# Patient Record
Sex: Female | Born: 1965 | Race: White | Hispanic: No | Smoking: Never smoker
Health system: Southern US, Community
[De-identification: ages and names within clinical notes are randomized; demographics above are authoritative.]

## PROBLEM LIST (undated history)

## (undated) DIAGNOSIS — Z8249 Family history of ischemic heart disease and other diseases of the circulatory system: Secondary | ICD-10-CM

## (undated) DIAGNOSIS — F419 Anxiety disorder, unspecified: Secondary | ICD-10-CM

## (undated) DIAGNOSIS — E785 Hyperlipidemia, unspecified: Secondary | ICD-10-CM

## (undated) DIAGNOSIS — R079 Chest pain, unspecified: Secondary | ICD-10-CM

## (undated) DIAGNOSIS — F329 Major depressive disorder, single episode, unspecified: Secondary | ICD-10-CM

## (undated) DIAGNOSIS — R7309 Other abnormal glucose: Secondary | ICD-10-CM

## (undated) DIAGNOSIS — J309 Allergic rhinitis, unspecified: Secondary | ICD-10-CM

## (undated) HISTORY — DX: Hyperlipidemia, unspecified: E78.5

## (undated) HISTORY — DX: Anxiety disorder, unspecified: F41.9

## (undated) HISTORY — DX: Family history of ischemic heart disease and other diseases of the circulatory system: Z82.49

## (undated) HISTORY — DX: Other abnormal glucose: R73.09

## (undated) HISTORY — DX: Chest pain, unspecified: R07.9

## (undated) HISTORY — DX: Allergic rhinitis, unspecified: J30.9

## (undated) HISTORY — DX: Major depressive disorder, single episode, unspecified: F32.9

---

## 1998-01-21 ENCOUNTER — Other Ambulatory Visit: Admission: RE | Admit: 1998-01-21 | Discharge: 1998-01-21 | Payer: Self-pay | Admitting: Obstetrics and Gynecology

## 1999-01-29 ENCOUNTER — Other Ambulatory Visit: Admission: RE | Admit: 1999-01-29 | Discharge: 1999-01-29 | Payer: Self-pay | Admitting: Obstetrics and Gynecology

## 1999-12-09 ENCOUNTER — Other Ambulatory Visit: Admission: RE | Admit: 1999-12-09 | Discharge: 1999-12-09 | Payer: Self-pay | Admitting: Obstetrics and Gynecology

## 2001-02-08 ENCOUNTER — Other Ambulatory Visit: Admission: RE | Admit: 2001-02-08 | Discharge: 2001-02-08 | Payer: Self-pay | Admitting: Obstetrics and Gynecology

## 2002-02-09 ENCOUNTER — Other Ambulatory Visit: Admission: RE | Admit: 2002-02-09 | Discharge: 2002-02-09 | Payer: Self-pay | Admitting: Obstetrics and Gynecology

## 2003-03-27 ENCOUNTER — Other Ambulatory Visit: Admission: RE | Admit: 2003-03-27 | Discharge: 2003-03-27 | Payer: Self-pay | Admitting: Obstetrics and Gynecology

## 2004-03-25 ENCOUNTER — Ambulatory Visit (HOSPITAL_COMMUNITY): Admission: RE | Admit: 2004-03-25 | Discharge: 2004-03-25 | Payer: Self-pay | Admitting: Gastroenterology

## 2004-03-31 ENCOUNTER — Other Ambulatory Visit: Admission: RE | Admit: 2004-03-31 | Discharge: 2004-03-31 | Payer: Self-pay | Admitting: Obstetrics and Gynecology

## 2005-04-10 ENCOUNTER — Other Ambulatory Visit: Admission: RE | Admit: 2005-04-10 | Discharge: 2005-04-10 | Payer: Self-pay | Admitting: Obstetrics and Gynecology

## 2005-05-13 ENCOUNTER — Encounter: Admission: RE | Admit: 2005-05-13 | Discharge: 2005-05-13 | Payer: Self-pay | Admitting: Obstetrics and Gynecology

## 2008-02-13 ENCOUNTER — Ambulatory Visit: Payer: Self-pay | Admitting: Gastroenterology

## 2008-02-20 ENCOUNTER — Telehealth (INDEPENDENT_AMBULATORY_CARE_PROVIDER_SITE_OTHER): Payer: Self-pay | Admitting: *Deleted

## 2008-02-27 ENCOUNTER — Ambulatory Visit: Payer: Self-pay | Admitting: Gastroenterology

## 2010-12-26 NOTE — Op Note (Signed)
NAME:  Alexis Carlson, Alexis Carlson                            ACCOUNT NO.:  [id number]   MEDICAL RECORD NO.:  [id number]                   PATIENT TYPE:  AMB   LOCATION:  ENDO                                 FACILITY:  [facility]   PHYSICIAN:  Graylin Shiver, M.D.                DATE OF BIRTH:  Aug 03, 1966   DATE OF PROCEDURE:  03/25/2004  DATE OF DISCHARGE:                                 OPERATIVE REPORT   PROCEDURE:  Colonoscopy.   INDICATION:  Family history of colon cancer in the patient's father.   Informed consent was obtained after explanation of the risks of bleeding,  infection and perforation.   PREMEDICATIONS:  1. Fentanyl 60 mcg IV.  2. Versed 4 mg IV.   PROCEDURE:  With the patient in the left lateral decubitus position a rectal  exam was performed, no masses were felt.  The Olympus colonoscope was  inserted into the rectum and advanced around the colon to the cecum.  Cecal  landmarks are identified.  The cecum and ascending colon were normal, the  transverse colon normal.  The descending colon, sigmoid and rectum were  normal.  She tolerated the procedure well without complications.   IMPRESSION:  Normal colonoscopy to the cecum.                                               Graylin Shiver, M.D.    [name]  D:  03/25/2004  T:  03/25/2004  Job:  [id number]

## 2012-12-15 ENCOUNTER — Encounter: Payer: Self-pay | Admitting: Gastroenterology

## 2013-07-11 ENCOUNTER — Encounter (INDEPENDENT_AMBULATORY_CARE_PROVIDER_SITE_OTHER): Payer: Self-pay

## 2013-07-11 ENCOUNTER — Ambulatory Visit (INDEPENDENT_AMBULATORY_CARE_PROVIDER_SITE_OTHER): Payer: BC Managed Care – PPO | Admitting: Family Medicine

## 2013-07-11 VITALS — BP 133/92 | HR 91 | Temp 98.5°F

## 2013-07-11 DIAGNOSIS — R109 Unspecified abdominal pain: Secondary | ICD-10-CM

## 2013-07-11 DIAGNOSIS — Z0000 Encounter for general adult medical examination without abnormal findings: Secondary | ICD-10-CM

## 2013-07-11 DIAGNOSIS — R5381 Other malaise: Secondary | ICD-10-CM

## 2013-07-11 LAB — POCT UA - MICROSCOPIC ONLY
Bacteria, U Microscopic: NEGATIVE
Casts, Ur, LPF, POC: NEGATIVE
Crystals, Ur, HPF, POC: NEGATIVE
Mucus, UA: NEGATIVE
RBC, urine, microscopic: NEGATIVE
WBC, Ur, HPF, POC: NEGATIVE
Yeast, UA: NEGATIVE

## 2013-07-11 LAB — POCT URINALYSIS DIPSTICK
Bilirubin, UA: NEGATIVE
Blood, UA: NEGATIVE
Glucose, UA: NEGATIVE
Ketones, UA: NEGATIVE
Leukocytes, UA: NEGATIVE
Nitrite, UA: NEGATIVE
Protein, UA: NEGATIVE
Spec Grav, UA: <=1.005
Urobilinogen, UA: NEGATIVE
pH, UA: 5

## 2013-07-11 NOTE — Progress Notes (Signed)
   Subjective:    Patient ID: Alexis Carlson, female    DOB: 1965/10/21, 47 y.o.   MRN: [id number]  HPI  This 47 y.o. female presents for evaluation of fatigue, bowel changes and constipation, hair loss, Weight gain, and lower abdominal discomfort.  She has been feeling like this for over a month. She has not been in the office or had labs in over a year.  She has hx of anxiety.  Review of Systems C/o fatigue, constipation, hair loss   No chest pain, SOB, HA, dizziness, vision change, N/V, diarrhea, constipation, dysuria, urinary urgency or frequency, myalgias, arthralgias or rash.  Objective:   Physical Exam  Vital signs noted  Well developed well nourished female.  HEENT - Head atraumatic Normocephalic                Eyes - PERRLA, Conjuctiva - clear Sclera- Clear EOMI                Ears - EAC's Wnl TM's Wnl Gross Hearing WNL                Nose - Nares patent                 Throat - oropharanx wnl Respiratory - Lungs CTA bilateral Cardiac - RRR S1 and S2 without murmur GI - Abdomen soft tender lower quadrant and bowel sounds active x 4 Extremities - No edema. Neuro - Grossly intact.      Assessment & Plan:  Other malaise and fatigue - Plan: CMP14+EGFR, Thyroid Panel With TSH, Vitamin B12, Vit D  25 hydroxy (rtn osteoporosis monitoring), CBC With differential/Platelet, CANCELED: POCT CBC  Abdominal pain, unspecified site - Plan: CMP14+EGFR, POCT urinalysis dipstick, POCT UA - Microscopic Only, CBC With differential/Platelet, CANCELED: POCT CBC  Routine general medical examination at a health care facility - Plan: CMP14+EGFR, Thyroid Panel With TSH, Vitamin B12, Vit D  25 hydroxy (rtn osteoporosis monitoring), CBC With differential/Platelet, CANCELED: POCT CBC  Deatra Canter FNP

## 2013-07-12 ENCOUNTER — Other Ambulatory Visit: Payer: Self-pay | Admitting: Family Medicine

## 2013-07-12 LAB — CMP14+EGFR
ALT: 9 IU/L (ref 0–32)
AST: 11 IU/L (ref 0–40)
Albumin/Globulin Ratio: 1.9 (ref 1.1–2.5)
Albumin: 4.6 g/dL (ref 3.5–5.5)
Alkaline Phosphatase: 75 IU/L (ref 39–117)
BUN/Creatinine Ratio: 12 (ref 9–23)
BUN: 9 mg/dL (ref 6–24)
CO2: 25 mmol/L (ref 18–29)
Calcium: 9.6 mg/dL (ref 8.7–10.2)
Chloride: 98 mmol/L (ref 97–108)
Creatinine, Ser: 0.76 mg/dL (ref 0.57–1.00)
GFR calc Af Amer: 108 mL/min/{1.73_m2} (ref >59)
GFR calc non Af Amer: 94 mL/min/{1.73_m2} (ref >59)
Globulin, Total: 2.4 g/dL (ref 1.5–4.5)
Glucose: 93 mg/dL (ref 65–99)
Potassium: 3.6 mmol/L (ref 3.5–5.2)
Sodium: 138 mmol/L (ref 134–144)
Total Bilirubin: 0.7 mg/dL (ref 0.0–1.2)
Total Protein: 7 g/dL (ref 6.0–8.5)

## 2013-07-12 LAB — CBC WITH DIFFERENTIAL
Basophils Absolute: 0.1 10*3/uL (ref 0.0–0.2)
Basos: 1 %
Eos: 1 %
Eosinophils Absolute: 0.1 10*3/uL (ref 0.0–0.4)
HCT: 35.4 % (ref 34.0–46.6)
Hemoglobin: 12.9 g/dL (ref 11.1–15.9)
Immature Grans (Abs): 0 10*3/uL (ref 0.0–0.1)
Immature Granulocytes: 0 %
Lymphocytes Absolute: 2.8 10*3/uL (ref 0.7–3.1)
Lymphs: 28 %
MCH: 32.2 pg (ref 26.6–33.0)
MCHC: 36.4 g/dL — ABNORMAL HIGH (ref 31.5–35.7)
MCV: 88 fL (ref 79–97)
Monocytes Absolute: 0.8 10*3/uL (ref 0.1–0.9)
Monocytes: 8 %
Neutrophils Absolute: 6.2 10*3/uL (ref 1.4–7.0)
Neutrophils Relative %: 62 %
Platelets: 282 10*3/uL (ref 150–379)
RBC: 4.01 x10E6/uL (ref 3.77–5.28)
RDW: 13.1 % (ref 12.3–15.4)
WBC: 10 10*3/uL (ref 3.4–10.8)

## 2013-07-12 LAB — VITAMIN B12: Vitamin B-12: 483 pg/mL (ref 211–946)

## 2013-07-12 LAB — VITAMIN D 25 HYDROXY (VIT D DEFICIENCY, FRACTURES): Vit D, 25-Hydroxy: 26.9 ng/mL — ABNORMAL LOW (ref 30.0–100.0)

## 2013-07-12 LAB — THYROID PANEL WITH TSH
Free Thyroxine Index: 1.6 (ref 1.2–4.9)
T3 Uptake Ratio: 26 % (ref 24–39)
T4, Total: 6.2 ug/dL (ref 4.5–12.0)
TSH: 3.25 u[IU]/mL (ref 0.450–4.500)

## 2013-07-12 MED ORDER — VITAMIN D (ERGOCALCIFEROL) 1.25 MG (50000 UNIT) PO CAPS: 50000.0000 [IU] | ORAL_CAPSULE | ORAL | Status: DC

## 2013-07-14 ENCOUNTER — Telehealth: Payer: Self-pay | Admitting: Family Medicine

## 2013-07-24 NOTE — Telephone Encounter (Signed)
Message copied by Baltazar Apo on Mon Jul 24, 2013  8:53 AM ------      Message from: Deatra Canter      Created: Wed Jul 12, 2013  8:40 AM       Vitamin D is low and will rx vitamin D and take 1000 iu po qd otc.  B12 level is normal.  Thyroid labs normal.  Overall her labs look good.  Her fatigue could be due to deconditioning and would       Recommend exercise regimen.  If fatigue persists consider follow up to explore other possible reasons for fatigue. ------

## 2013-07-24 NOTE — Telephone Encounter (Signed)
Pt notified and verbalized understanding.

## 2013-09-07 ENCOUNTER — Encounter: Payer: Self-pay | Admitting: Gastroenterology

## 2013-12-06 ENCOUNTER — Emergency Department (HOSPITAL_COMMUNITY)
Admission: EM | Admit: 2013-12-06 | Discharge: 2013-12-06 | Disposition: A | Discharge status: Home / Self Care | Payer: BC Managed Care – PPO | Attending: Emergency Medicine | Admitting: Emergency Medicine

## 2013-12-06 ENCOUNTER — Encounter (HOSPITAL_COMMUNITY): Payer: Self-pay | Admitting: Emergency Medicine

## 2013-12-06 DIAGNOSIS — E119 Type 2 diabetes mellitus without complications: Secondary | ICD-10-CM | POA: Insufficient documentation

## 2013-12-06 DIAGNOSIS — Z8719 Personal history of other diseases of the digestive system: Secondary | ICD-10-CM | POA: Insufficient documentation

## 2013-12-06 DIAGNOSIS — N764 Abscess of vulva: Secondary | ICD-10-CM | POA: Insufficient documentation

## 2013-12-06 NOTE — ED Provider Notes (Signed)
CSN: [id number]     Arrival date & time 12/06/13  1104 History   First MD Initiated Contact with Patient 12/06/13 1129     Chief Complaint  Patient presents with  . Abscess     (Consider location/radiation/quality/duration/timing/severity/associated sxs/prior Treatment) HPI  Alexis Carlson is a(n) 48 y.o. female who presents to the emergency Department for chief complaint of labial abscess.  The patient complains of left labial pain since last Thursday and acutely worsening since Saturday.  Patient states that she noticed heat, redness, swelling extended from the top of the left labia down into her left perineum and gluteal area.  The patient was given a prescription for antibiotics by her OB/GYN however she did not have incision and drainage at that time.  Patient states that last night her pain became severe.  She had used to [employer]  She is applying warm compresses when the abscess opened and drained.  Her sister who is a Marine scientist took a look at it and expressed more of the purulence.  Sheet her sister also probed with a Q-tip and found no other tracking however she was concerned there may be loculations.  The patient states that today the area is significantly less painful, less swollen, less erythematous.  She wanted to make sure she didn't need any IV antibiotics or have any retained purulence in the abscess.  Patient denies any fevers, chills, myalgias, nausea or vomiting.  She denies any vaginal or urinary symptoms.  She is a history of diabetes.  She has a history of previous abscess teeth.  She does frequently shave her her genital region.  History reviewed. No pertinent past medical history. History reviewed. No pertinent past surgical history. No family history on file. History  Substance Use Topics  . Smoking status: Never Smoker   . Smokeless tobacco: Not on file  . Alcohol Use: Yes     Comment: social   OB History   Grav Para Term Preterm Abortions TAB SAB Ect Mult Living        Review of Systems  Ten systems reviewed and are negative for acute change, except as noted in the HPI.    Allergies  Review of patient's allergies indicates no known allergies.  Home Medications   Prior to Admission medications   Medication Sig Start Date End Date Taking? Authorizing Provider  venlafaxine XR (EFFEXOR-XR) 150 MG 24 hr capsule  06/27/13   Historical Provider, MD  Vitamin D, Ergocalciferol, (DRISDOL) 50000 UNITS CAPS capsule Take 1 capsule (50,000 Units total) by mouth every 7 (seven) days. 07/12/13   Lysbeth Penner, FNP   BP 109/73  Pulse 85  Temp(Src) 98.7 F (37.1 C) (Oral)  Resp 20  SpO2 99% Physical Exam Physical Exam  Nursing note and vitals reviewed. Constitutional: She is oriented to person, place, and time. She appears well-developed and well-nourished. No distress.  HENT:  Head: Normocephalic and atraumatic.  Eyes: Conjunctivae normal and EOM are normal. Pupils are equal, round, and reactive to light. No scleral icterus.  Neck: Normal range of motion.  Cardiovascular: Normal rate, regular rhythm and normal heart sounds.  Exam reveals no gallop and no friction rub.   No murmur heard. Pulmonary/Chest: Effort normal and breath sounds normal. No respiratory distress.  Abdominal: Soft. Bowel sounds are normal. She exhibits no distension and no mass. There is no tenderness. There is no guarding.  Neurological: She is alert and oriented to person, place, and time.  Skin: Skin is warm and dry. She  is not diaphoretic.  Genitourinary: Left labia with erythema, mild swelling, there is a 1 cm area of crusting no central fluctuants, new noted induration, no streaking.  Mild tenderness to palpation.  ED Course  Procedures (including critical care time) Labs Review Labs Reviewed - No data to display  Imaging Review No results found.   EKG Interpretation None     Bedside ultrasound is performed.  There were only appears to be cellulitic tissue, no  retained pockets of purulence. MDM   Final diagnoses:  Left genital labial abscess   No I&D is necessary at this time.  Patient may continue with her prescribed antibiotics.  I have discussed return precautions.  She should have her wound checked in 2 days.  Patient verbalizes her understanding and agrees with plan of care.    Margarita Mail, PA-C 12/06/13 1258

## 2013-12-06 NOTE — Discharge Instructions (Signed)
Disorders of the Vulva It is important to know the outside (external) area of the female genitalia to properly examine yourself. The vulva or labia, sometimes also called the lips around the vagina, covers the pubic bone and surrounds the vagina. The larger or outer labia is called the labia majora. The inner or smaller labia is called the labia minora. The clitoris is at the top of the vulva. It is covered by a small area of tissue called the hood. Below the clitoris is the opening of the tube from the bladder, which is the urethral opening. Just below the urethral opening is the opening of the vagina. This area is called the vestibule. Inside the vestibule are bartholin and skene glands that lubricate the outside of the vagina during sexual intercourse. The perineum is the area that lies between the vagina and anus. You should examine your external genitalia once a month just as you do your self breast examination. SIGNS AND SYMPTOMS TO LOOK FOR DURING YOUR EXAMINATION:  Swelling.  Redness.  Bumps.  Blisters.  Black or white areas.  Itching.  Bleeding.  Burning. TYPES OF VULVAR PROBLEMS  Infections.  Yeast (fungus) is the most common infection that causes redness, swelling, itching and a thick white vaginal discharge. Women with diabetes, taking medicines that kill germs (antibiotics) or on birth control pills are at risk for yeast infection. This infection is treated with vaginal creams, suppositories and anti-itch cream for the vulva.  Genital warts (condyloma) are caused by the human papilloma virus. They are raised bumps that can itch, bleed, cause discomfort and sometimes appear in groups like cauliflower. This is a sexually transmitted disease (STD) caused by sexual contact. This can be treated with topical medication, freezing, electrocautery, laser or surgical removal.  Genital herpes is a virus that causes redness, swelling, blisters and ulcers that can cause itching and are  very painful. This is also a STD. There are medications to control the symptoms of herpes, but there is no cure. Once you have it, you keep getting it because the virus stays in your body.  Contact dermatitis. Contact dermatitis is an irritation of the vulva that can cause redness, swelling and itching. It can be caused by:  Perfumed toilet paper.  Deodorants.  Talcum powder.  Hygiene sprays.  Tampons.  Soaps and fabric softeners.  Wearing wet underwear and bathing suits too long.  Spermicide.  Condoms.  Diaphragms.  Poison ivy. Treatment will depend on eliminating the cause and treating the symptoms.  Vulvodynia.  Vulvodynia is "painful vulva." It includes burning, itching, irritation and a feeling of rawness or bruising of the vulva. Treating this problem depends on the cause and diagnosis. Treatment can take a long time.  Vulvar Dystrophy.  Vulvar Dystrophy is a disorder of the skin of the vulva. The skin can be too thick with hard raised patches or too thin skin showing wrinkles. Vulvar dystrophy can cause redness or white patches, itching and burning sensation. A biopsy may be needed to diagnose the problem. Treatment with creams or ointments depends on the diagnosis.  Vulvar Cancer.  Vulvar cancer is usually a form of squamus skin cancer. Other types of vulvar cancer like melenoma (a dark or black, irregular shaped mole that can bleed easily) and adenocarcinoma (red, itchy, scaly area that looks like eczema) are rare. Treatment is usually surgery to remove the cancerous area, the vulva and the lymph nodes in the groin. This can be done with or without radiation and chemotherapy. HOME   CARE INSTRUCTIONS   Look at your vulva and external genital area every month.  Follow and finish all treatment given to you by your caregiver.  Do not use perfumed or scented soaps, detergents, hygiene spray, talcum powder, tampons, douches or toilet paper.  Remove your tampon every 6  hours. Do not leave tampons in overnight.  Wear loose-fitting clothing.  Wear underwear that is cotton.  Avoid spermicidal creams or foams that irritate you. SEEK MEDICAL CARE IF:   You notice changes on your vulva such as redness, swelling, itching, color changes, bleeding, small bumps, blisters or any discomfort.  You develop an abnormal vaginal discharge.  You have painful sexual intercourse.  You notice swelling of the lymph nodes in your groin. Document Released: 01/13/2008 Document Revised: 10/19/2011 Document Reviewed: 10/27/2010 [facility] Patient Information 2014 [place], [place]. Abscess Care After An abscess (also called a boil or furuncle) is an infected area that contains a collection of pus. Signs and symptoms of an abscess include pain, tenderness, redness, or hardness, or you may feel a moveable soft area under your skin. An abscess can occur anywhere in the body. The infection may spread to surrounding tissues causing cellulitis. A cut (incision) by the surgeon was made over your abscess and the pus was drained out. Gauze may have been packed into the space to provide a drain that will allow the cavity to heal from the inside outwards. The boil may be painful for 5 to 7 days. Most people with a boil do not have high fevers. Your abscess, if seen early, may not have localized, and may not have been lanced. If not, another appointment may be required for this if it does not get better on its own or with medications. HOME CARE INSTRUCTIONS   Only take over-the-counter or prescription medicines for pain, discomfort, or fever as directed by your caregiver.  When you bathe, soak and then remove gauze or iodoform packs at least daily or as directed by your caregiver. You may then wash the wound gently with mild soapy water. Repack with gauze or do as your caregiver directs. SEEK IMMEDIATE MEDICAL CARE IF:   You develop increased pain, swelling, redness, drainage, or bleeding in the  wound site.  You develop signs of generalized infection including muscle aches, chills, fever, or a general ill feeling.  An oral temperature above 102 F (38.9 C) develops, not controlled by medication. See your caregiver for a recheck if you develop any of the symptoms described above. If medications (antibiotics) were prescribed, take them as directed. Document Released: 02/12/2005 Document Revised: 10/19/2011 Document Reviewed: 10/10/2007 [facility] Patient Information 2014 [place].

## 2013-12-06 NOTE — ED Provider Notes (Signed)
Medical screening examination/treatment/procedure(s) were performed by non-physician practitioner and as supervising physician I was immediately available for consultation/collaboration.   EKG Interpretation None        Charles B. Karle Starch, MD 12/06/13 1258

## 2013-12-06 NOTE — ED Notes (Signed)
Pt presents to department for evaluation of vaginal abscess. Ongoing since Thursday. Pt states drainage from site and increased pain today. Was started on antibiotics by OBGYN last week for same. 8/10 pain upon arrival. Pt is alert and oriented x4.

## 2014-05-22 ENCOUNTER — Encounter: Payer: Self-pay | Admitting: Nurse Practitioner

## 2014-05-22 ENCOUNTER — Ambulatory Visit (INDEPENDENT_AMBULATORY_CARE_PROVIDER_SITE_OTHER): Payer: BC Managed Care – PPO | Admitting: Nurse Practitioner

## 2014-05-22 VITALS — BP 121/89 | HR 91 | Temp 97.5°F | Ht 60.0 in | Wt 169.0 lb

## 2014-05-22 DIAGNOSIS — F32A Depression, unspecified: Secondary | ICD-10-CM

## 2014-05-22 DIAGNOSIS — J0110 Acute frontal sinusitis, unspecified: Secondary | ICD-10-CM

## 2014-05-22 DIAGNOSIS — J309 Allergic rhinitis, unspecified: Secondary | ICD-10-CM

## 2014-05-22 DIAGNOSIS — F329 Major depressive disorder, single episode, unspecified: Secondary | ICD-10-CM | POA: Insufficient documentation

## 2014-05-22 DIAGNOSIS — G4452 New daily persistent headache (NDPH): Secondary | ICD-10-CM

## 2014-05-22 HISTORY — DX: Allergic rhinitis, unspecified: J30.9

## 2014-05-22 HISTORY — DX: Depression, unspecified: F32.A

## 2014-05-22 MED ORDER — AZITHROMYCIN 250 MG PO TABS: ORAL_TABLET | ORAL | Status: DC

## 2014-05-22 NOTE — Patient Instructions (Signed)

## 2014-05-22 NOTE — Progress Notes (Signed)
   Subjective:    Patient ID: Alexis Carlson, female    DOB: 1966-04-09, 48 y.o.   MRN: [id number]  HPI Patient has been having frequent headaches- can't tell if it is her sinuses or migraines- Headaches are worse on the weekends- She said that the pain is along the bridge of her nose- She has tried a bunch of OTC meds with minimal relief. SHe is on contrave for weight loss and she is not sure if that is causing headache or not.  * denies over consumption of caffeine- no goody powders  Review of Systems  Constitutional: Negative.   HENT: Positive for congestion and sinus pressure.   Respiratory: Negative.  Negative for cough.   Cardiovascular: Negative.   Gastrointestinal: Negative.   Neurological: Positive for dizziness and headaches.  Hematological: Negative.   Psychiatric/Behavioral: Negative.   All other systems reviewed and are negative.      Objective:   Physical Exam  Constitutional: She is oriented to person, place, and time. She appears well-developed and well-nourished.  HENT:  Right Ear: Hearing, tympanic membrane, external ear and ear canal normal.  Left Ear: Hearing, tympanic membrane, external ear and ear canal normal.  Nose: Mucosal edema and rhinorrhea present. Right sinus exhibits frontal sinus tenderness. Right sinus exhibits no maxillary sinus tenderness. Left sinus exhibits frontal sinus tenderness. Left sinus exhibits no maxillary sinus tenderness.  Mouth/Throat: Uvula is midline, oropharynx is clear and moist and mucous membranes are normal.  Eyes: Pupils are equal, round, and reactive to light.  Neck: Normal range of motion. Neck supple.  Cardiovascular: Normal rate and normal heart sounds.   Pulmonary/Chest: Effort normal and breath sounds normal.  Abdominal: Soft. Bowel sounds are normal.  Lymphadenopathy:    She has no cervical adenopathy.  Neurological: She is alert and oriented to person, place, and time. She has normal reflexes.  Skin: Skin is warm and  dry.  Psychiatric: She has a normal mood and affect. Her behavior is normal. Judgment and thought content normal.     BP 121/89  Pulse 91  Temp(Src) 97.5 F (36.4 C) (Oral)  Ht 5' (1.524 m)  Wt 169 lb (76.658 kg)  BMI 33.01 kg/m2      Assessment & Plan:  1. Acute frontal sinusitis, recurrence not specified 1. Take meds as prescribed 2. Use a cool mist humidifier especially during the winter months and when heat has been humid. 3. Use saline nose sprays frequently 4. Saline irrigations of the nose can be very helpful if done frequently.  * 4X daily for 1 week*  * Use of a nettie pot can be helpful with this. Follow directions with this* 5. Drink plenty of fluids 6. Keep thermostat turn down low 7.For any cough or congestion  Use plain Mucinex- regular strength or max strength is fine   * Children- consult with Pharmacist for dosing 8. For fever or aces or pains- take tylenol or ibuprofen appropriate for age and weight.  * for fevers greater than 101 orally you may alternate ibuprofen and tylenol every  3 hours.    - azithromycin (ZITHROMAX Z-PAK) 250 MG tablet; As directed  Dispense: 6 each; Refill: 0  2. New daily persistent headache Headache diary Avoid all caffeine   Mary-Margaret Hassell Done, FNP

## 2014-05-24 ENCOUNTER — Telehealth: Payer: Self-pay | Admitting: Nurse Practitioner

## 2014-05-24 NOTE — Telephone Encounter (Signed)
Motrin OTC

## 2014-05-25 NOTE — Telephone Encounter (Signed)
Left message with suggestions. Asked patient to return call if necessary.

## 2015-08-29 ENCOUNTER — Encounter: Payer: Self-pay | Admitting: Gastroenterology

## 2018-12-12 ENCOUNTER — Telehealth: Payer: Self-pay | Admitting: Interventional Cardiology

## 2018-12-12 NOTE — Telephone Encounter (Signed)
Spoke with pt and she was agreeable to a virtual visit with Dr. Tamala Julian on 5/12 using [employer].  Advised someone would call her to go over details.  Pt appreciative for call.

## 2018-12-19 NOTE — Progress Notes (Signed)
Virtual Visit via Video Note   This visit type was conducted due to national recommendations for restrictions regarding the COVID-19 Pandemic (e.g. social distancing) in an effort to limit this patient's exposure and mitigate transmission in our community.  Due to her co-morbid illnesses, this patient is at least at moderate risk for complications without adequate follow up.  This format is felt to be most appropriate for this patient at this time.  All issues noted in this document were discussed and addressed.  A limited physical exam was performed with this format.  Please refer to the patient's chart for her consent to telehealth for [facility].   Date:  12/19/2018   ID:  Alexis Carlson, DOB [birth year], MRN [id number]  Patient Location: Home Provider Location: Office  PCP:  Chevis Pretty, FNP  Cardiologist:  No primary care provider on file.  Electrophysiologist:  None   Evaluation Performed:  Consultation - Lodie Waheed was referred by Dr. Hinton Dyer Collins([facility]) for the evaluation because of a strong family h/o heart disease..  Chief Complaint:  Risk Factors  History of Present Illness:    Alexis Carlson is a 53 y.o. female with personal history of hyperlipidemia, and strong family history of ischemic heart disease and myocardial infarction (2 sisters, mother, and father).  Alexis Carlson notices dyspnea and some mild tightness in her chest with physical activity.  She is a Radio producer.  Physical activity has significantly decreased since the pandemic started and requires that she work by computer from home.  She is particularly concerned about her cardiovascular health because of her family history and risk profile with significant elevation in triglycerides and LDL cholesterol.  Most recent triglyceride was 214 with an LDL of 152 on the VASCEPA.  At the time of the most recent laboratory data, atorvastatin 20 mg was added.  She does not smoke.  She does not believe she snores.  She feels  sleeping is not a problem but she does get tired pretty easily during the day.  No history of diabetes.  The patient does not have symptoms concerning for COVID-19 infection (fever, chills, cough, or new shortness of breath).    Past Medical History:  Diagnosis Date   Allergic rhinitis 05/22/2014   Depression (emotion) 05/22/2014   FH: heart disease    No past surgical history on file.   No outpatient medications have been marked as taking for the 12/20/18 encounter (Appointment) with Belva Crome, MD.     Allergies:   Patient has no known allergies.   Social History   Tobacco Use   Smoking status: Never Smoker  Substance Use Topics   Alcohol use: Yes    Comment: social   Drug use: No     Family Hx: The patient's family history is not on file.  ROS:   Please see the history of present illness.    Obesity and decreased physical activity.  Anxiety for which she uses Effexor XR. All other systems reviewed and are negative.   Prior CV studies:   The following studies were reviewed today:  No cardiac imaging.  Labs/Other Tests and Data Reviewed:    EKG:  An ECG dated March 2020 was personally reviewed today and demonstrated:  Reveals normal sinus rhythm with normal appearance.  Recent Labs: No results found for requested labs within last 8760 hours.   Recent Lipid Panel No results found for: CHOL, TRIG, HDL, CHOLHDL, LDLCALC, LDLDIRECT  Wt Readings from Last 3 Encounters:  05/22/14 169 lb (76.7  kg)     Objective:    Vital Signs:  There were no vitals taken for this visit.   VITAL SIGNS:  reviewed GEN:  no acute distress RESPIRATORY:  normal respiratory effort, symmetric expansion CARDIOVASCULAR:  no peripheral edema NEURO:  alert and oriented x 3, no obvious focal deficit  ASSESSMENT & PLAN:    1. Family history of early CAD   2. Hyperlipidemia LDL goal <70   3. 2019 novel coronavirus disease (COVID-19)    PLAN:  1. Chest tightness and  dyspnea on exertion occur with moderate to heavy physical activity.  This is not alarmed the patient because she does not feel limited in her typical daily activities.  The symptoms only occur with walking greater than 50 yards briskly or if going up several flights of stairs.  Coronary CT angios with morphology and FFR if needed will be ordered. 2. Family history is significant and is noted to involve both mom, father, and 2 siblings all before the age of 37.   2. May have heterozygous hypercholesterolemia/mixed hyperlipidemia.  Currently on icosapent ethyl and low to medium intensity statin therapy.  Reassess lipids along with other markers including an LP(a), high-sensitivity CRP, and hemoglobin A1c in 1 month.  Overall education and awareness concerning primary risk prevention was discussed in detail: LDL less than 70, hemoglobin A1c less than 7, blood pressure target less than 130/80 mmHg, >150 minutes of moderate aerobic activity per week, avoidance of smoking, weight control (via diet and exercise), and continued surveillance/management of/for obstructive sleep apnea.   COVID-19 Education: The signs and symptoms of COVID-19 were discussed with the patient and how to seek care for testing (follow up with PCP or arrange E-visit).  The importance of social distancing was discussed today.  Time:   Today, I have spent 26 minutes with the patient with telehealth technology discussing the above problems.     Medication Adjustments/Labs and Tests Ordered: Current medicines are reviewed at length with the patient today.  Concerns regarding medicines are outlined above.   Tests Ordered: No orders of the defined types were placed in this encounter.   Medication Changes: No orders of the defined types were placed in this encounter.   Disposition:  Follow up in 3 month(s)  Signed, Sinclair Grooms, MD  12/19/2018 3:41 PM    [facility]

## 2018-12-20 ENCOUNTER — Other Ambulatory Visit: Payer: Self-pay

## 2018-12-20 ENCOUNTER — Telehealth (INDEPENDENT_AMBULATORY_CARE_PROVIDER_SITE_OTHER): Payer: BC Managed Care – PPO | Admitting: Interventional Cardiology

## 2018-12-20 ENCOUNTER — Telehealth: Payer: Self-pay

## 2018-12-20 VITALS — Ht 60.0 in | Wt 172.0 lb

## 2018-12-20 DIAGNOSIS — R0789 Other chest pain: Secondary | ICD-10-CM

## 2018-12-20 DIAGNOSIS — U071 COVID-19: Secondary | ICD-10-CM

## 2018-12-20 DIAGNOSIS — Z8249 Family history of ischemic heart disease and other diseases of the circulatory system: Secondary | ICD-10-CM

## 2018-12-20 DIAGNOSIS — E785 Hyperlipidemia, unspecified: Secondary | ICD-10-CM

## 2018-12-20 MED ORDER — METOPROLOL TARTRATE 100 MG PO TABS: ORAL_TABLET | ORAL | 0 refills | Status: DC

## 2018-12-20 NOTE — Patient Instructions (Addendum)
Medication Instructions:  Your physician recommends that you continue on your current medications as directed. Please refer to the Current Medication list given to you today.  If you need a refill on your cardiac medications before your next appointment, please call your pharmacy.   Lab work: Your physician recommends that you return for lab work in: 1 month. You will need to be fasting for these labs (nothing to eat or drink after midnight except water and black coffee).  If you have labs (blood work) drawn today and your tests are completely normal, you will receive your results only by: Marland Kitchen MyChart Message (if you have MyChart) OR . A paper copy in the mail If you have any lab test that is abnormal or we need to change your treatment, we will call you to review the results.  Testing/Procedures: Non-Cardiac CT scanning, (CAT scanning), is a noninvasive, special x-ray that produces cross-sectional images of the body using x-rays and a computer. CT scans help physicians diagnose and treat medical conditions. For some CT exams, a contrast material is used to enhance visibility in the area of the body being studied. CT scans provide greater clarity and reveal more details than regular x-ray exams.   Follow-Up: At [facility], you and your health needs are our priority.  As part of our continuing mission to provide you with exceptional heart care, we have created designated Provider Care Teams.  These Care Teams include your primary Cardiologist (physician) and Advanced Practice Providers (APPs -  Physician Assistants and Nurse Practitioners) who all work together to provide you with the care you need, when you need it. You will need a follow up appointment in 2 months.  Please call our office 2 months in advance to schedule this appointment.  You may see Dr. Tamala Julian or one of the following Advanced Practice Providers on your designated Care Team:   Truitt Merle, NP Cecilie Kicks, NP . Kathyrn Drown, NP  Any Other Special Instructions Will Be Listed Below (If Applicable).  Please arrive at the [facility] main entrance of [facility] at 30-45 minutes prior to test start time  [facility] [place], [place], [place] [address] [phone]  Proceed to the [facility] [place] Radiology Department (First Floor).  Please follow these instructions carefully (unless otherwise directed):  On the Night Before the Test: . Be sure to Drink plenty of water. . Do not consume any caffeinated/decaffeinated beverages or chocolate 12 hours prior to your test. . Do not take any antihistamines 12 hours prior to your test. . If you take Metformin do not take 24 hours prior to test.  On the Day of the Test: . Drink plenty of water. Do not drink any water within one hour of the test. . Do not eat any food 4 hours prior to the test. . You may take your regular medications prior to the test.  . Take metoprolol (Lopressor) two hours prior to test. . HOLD Furosemide/Hydrochlorothiazide morning of the test.       After the Test: . Drink plenty of water. . After receiving IV contrast, you may experience a mild flushed feeling. This is normal. . On occasion, you may experience a mild rash up to 24 hours after the test. This is not dangerous. If this occurs, you can take Benadryl 25 mg and increase your fluid intake. . If you experience trouble breathing, this can be serious. If it is severe call 911 IMMEDIATELY. If it is  mild, please call our office. . If you take any of these medications: Glipizide/Metformin, Avandament, Glucavance, please do not take 48 hours after completing test.

## 2018-12-20 NOTE — Telephone Encounter (Signed)
YOUR CARDIOLOGY TEAM HAS ARRANGED FOR AN E-VISIT FOR YOUR APPOINTMENT - PLEASE REVIEW IMPORTANT INFORMATION BELOW SEVERAL DAYS PRIOR TO YOUR APPOINTMENT  Due to the recent COVID-19 pandemic, we are transitioning in-person office visits to tele-medicine visits in an effort to decrease unnecessary exposure to our patients, their families, and staff. These visits are billed to your insurance just like a normal visit is. We also encourage you to sign up for MyChart if you have not already done so. You will need a smartphone if possible. For patients that do not have this, we can still complete the visit using a regular telephone but do prefer a smartphone to enable video when possible. You may have a family member that lives with you that can help. If possible, we also ask that you have a blood pressure cuff and scale at home to measure your blood pressure, heart rate and weight prior to your scheduled appointment. Patients with clinical needs that need an in-person evaluation and testing will still be able to come to the office if absolutely necessary. If you have any questions, feel free to call our office.     YOUR PROVIDER WILL BE USING THE FOLLOWING PLATFORM TO COMPLETE YOUR VISIT: Doximity  . IF USING MYCHART - How to Download the MyChart App to Your SmartPhone   - If Apple, go to App Store and type in MyChart in the search bar and download the app. If Android, ask patient to go to Google Play Store and type in MyChart in the search bar and download the app. The app is free but as with any other app downloads, your phone may require you to verify saved payment information or Apple/Android password.  - You will need to then log into the app with your MyChart username and password, and select [facility] as your healthcare provider to link the account.  - When it is time for your visit, go to the MyChart app, find appointments, and click Begin Video Visit. Be sure to Select Allow for your device to  access the Microphone and Camera for your visit. You will then be connected, and your provider will be with you shortly.  **If you have any issues connecting or need assistance, please contact MyChart service desk [phone] ([phone])**  **If using a computer, in order to ensure the best quality for your visit, you will need to use either of the following Internet Browsers: Google Chrome or Microsoft Edge**  . IF USING DOXIMITY or [email or web address].ME - The staff will give you instructions on receiving your link to join the meeting the day of your visit.      2-3 DAYS BEFORE YOUR APPOINTMENT  You will receive a telephone call from one of our HeartCare team members - your caller ID may say "Unknown caller." If this is a video visit, we will walk you through how to get the video launched on your phone. We will remind you check your blood pressure, heart rate and weight prior to your scheduled appointment. If you have an Apple Watch or Kardia, please upload any pertinent ECG strips the day before or morning of your appointment to MyChart. Our staff will also make sure you have reviewed the consent and agree to move forward with your scheduled tele-health visit.     THE DAY OF YOUR APPOINTMENT  Approximately 15 minutes prior to your scheduled appointment, you will receive a telephone call from one of HeartCare team - your caller ID may say "Unknown caller."    Our staff will confirm medications, vital signs for the day and any symptoms you may be experiencing. Please have this information available prior to the time of visit start. It may also be helpful for you to have a pad of paper and pen handy for any instructions given during your visit. They will also walk you through joining the smartphone meeting if this is a video visit.    CONSENT FOR TELE-HEALTH VISIT - PLEASE REVIEW  I hereby voluntarily request, consent and authorize [facility] and its employed or contracted physicians, physician  assistants, nurse practitioners or other licensed health care professionals (the Practitioner), to provide me with telemedicine health care services (the "Services") as deemed necessary by the treating Practitioner. I acknowledge and consent to receive the Services by the Practitioner via telemedicine. I understand that the telemedicine visit will involve communicating with the Practitioner through live audiovisual communication technology and the disclosure of certain medical information by electronic transmission. I acknowledge that I have been given the opportunity to request an in-person assessment or other available alternative prior to the telemedicine visit and am voluntarily participating in the telemedicine visit.  I understand that I have the right to withhold or withdraw my consent to the use of telemedicine in the course of my care at any time, without affecting my right to future care or treatment, and that the Practitioner or I may terminate the telemedicine visit at any time. I understand that I have the right to inspect all information obtained and/or recorded in the course of the telemedicine visit and may receive copies of available information for a reasonable fee.  I understand that some of the potential risks of receiving the Services via telemedicine include:  . Delay or interruption in medical evaluation due to technological equipment failure or disruption; . Information transmitted may not be sufficient (e.g. poor resolution of images) to allow for appropriate medical decision making by the Practitioner; and/or  . In rare instances, security protocols could fail, causing a breach of personal health information.  Furthermore, I acknowledge that it is my responsibility to provide information about my medical history, conditions and care that is complete and accurate to the best of my ability. I acknowledge that Practitioner's advice, recommendations, and/or decision may be based on  factors not within their control, such as incomplete or inaccurate data provided by me or distortions of diagnostic images or specimens that may result from electronic transmissions. I understand that the practice of medicine is not an exact science and that Practitioner makes no warranties or guarantees regarding treatment outcomes. I acknowledge that I will receive a copy of this consent concurrently upon execution via email to the email address I last provided but may also request a printed copy by calling the office of [facility].    I understand that my insurance will be billed for this visit.   I have read or had this consent read to me. . I understand the contents of this consent, which adequately explains the benefits and risks of the Services being provided via telemedicine.  . I have been provided ample opportunity to ask questions regarding this consent and the Services and have had my questions answered to my satisfaction. . I give my informed consent for the services to be provided through the use of telemedicine in my medical care  By participating in this telemedicine visit I agree to the above.  

## 2019-01-17 ENCOUNTER — Telehealth (HOSPITAL_COMMUNITY): Payer: Self-pay | Admitting: Emergency Medicine

## 2019-01-17 NOTE — Telephone Encounter (Signed)
Reaching out to patient to offer assistance regarding upcoming cardiac imaging study; pt verbalizes understanding of appt date/time, parking situation and where to check in, pre-test NPO status and medications ordered, and verified current allergies; name and call back number provided for further questions should they arise Roseanne Juenger RN Navigator Cardiac Imaging [facility] [phone] office [phone] cell  Pt denies covid symptoms, verbalized understanding of visitor policy. 

## 2019-01-18 ENCOUNTER — Ambulatory Visit (HOSPITAL_COMMUNITY)
Admission: RE | Admit: 2019-01-18 | Discharge: 2019-01-18 | Disposition: A | Discharge status: Home / Self Care | Payer: BC Managed Care – PPO | Source: Ambulatory Visit | Attending: Interventional Cardiology | Admitting: Interventional Cardiology

## 2019-01-18 ENCOUNTER — Ambulatory Visit (HOSPITAL_COMMUNITY): Payer: BC Managed Care – PPO

## 2019-01-18 ENCOUNTER — Other Ambulatory Visit: Payer: Self-pay

## 2019-01-18 DIAGNOSIS — R0789 Other chest pain: Secondary | ICD-10-CM | POA: Insufficient documentation

## 2019-01-18 DIAGNOSIS — Z8249 Family history of ischemic heart disease and other diseases of the circulatory system: Secondary | ICD-10-CM | POA: Insufficient documentation

## 2019-01-18 DIAGNOSIS — Z006 Encounter for examination for normal comparison and control in clinical research program: Secondary | ICD-10-CM

## 2019-01-18 MED ORDER — NITROGLYCERIN 0.4 MG SL SUBL
0.8000 mg | SUBLINGUAL_TABLET | Freq: Once | SUBLINGUAL | Status: AC
  Administered 2019-01-18: 0.8 mg via SUBLINGUAL

## 2019-01-18 MED ORDER — IOHEXOL 350 MG/ML SOLN
85.0000 mL | Freq: Once | INTRAVENOUS | Status: AC | PRN
  Administered 2019-01-18: 12:00:00 85 mL via INTRAVENOUS

## 2019-01-18 MED ORDER — NITROGLYCERIN 0.4 MG SL SUBL
SUBLINGUAL_TABLET | SUBLINGUAL | Status: AC
  Filled 2019-01-18: qty 2

## 2019-01-18 NOTE — Progress Notes (Signed)
Naja Omahoney met inclusion and exclusion criteria.  The informed consent form, study requirements and expectations were reviewed with the subject and questions and concerns were addressed prior to the signing of the consent form.  The subject verbalized understanding of the trial requirements.  The subject agreed to participate in the CADFEM trial and signed the informed consent.  The informed consent was obtained prior to performance of any protocol-specific procedures for the subject.  A copy of the signed informed consent was given to the subject and a copy was placed in the subject's medical record.   Angela M. Thong Research Assistant 

## 2019-01-20 ENCOUNTER — Telehealth: Payer: Self-pay | Admitting: *Deleted

## 2019-01-20 NOTE — Telephone Encounter (Signed)
    COVID-19 Pre-Screening Questions:  . In the past 7 to 10 days have you had a cough,  shortness of breath, headache, congestion, fever (100 or greater) body aches, chills, sore throat, or sudden loss of taste or sense of smell? . Have you been around anyone with known Covid 19. . Have you been around anyone who is awaiting Covid 19 test results in the past 7 to 10 days? . Have you been around anyone who has been exposed to Covid 19, or has mentioned symptoms of Covid 19 within the past 7 to 10 days?  If you have any concerns/questions about symptoms patients report during screening (either on the phone or at threshold). Contact the provider seeing the patient or DOD for further guidance.  If neither are available contact a member of the leadership team.          Contacted patient via phone call all Covid 19 questions were answered no . Pt has a mask. KB

## 2019-01-24 ENCOUNTER — Other Ambulatory Visit: Payer: BC Managed Care – PPO

## 2019-01-24 ENCOUNTER — Other Ambulatory Visit: Payer: Self-pay

## 2019-01-24 DIAGNOSIS — Z8249 Family history of ischemic heart disease and other diseases of the circulatory system: Secondary | ICD-10-CM

## 2019-01-24 DIAGNOSIS — E785 Hyperlipidemia, unspecified: Secondary | ICD-10-CM

## 2019-01-24 DIAGNOSIS — R0789 Other chest pain: Secondary | ICD-10-CM

## 2019-01-25 LAB — HEPATIC FUNCTION PANEL
ALT: 18 IU/L (ref 0–32)
AST: 20 IU/L (ref 0–40)
Albumin: 4.7 g/dL (ref 3.8–4.9)
Alkaline Phosphatase: 119 IU/L — ABNORMAL HIGH (ref 39–117)
Bilirubin Total: 1.2 mg/dL (ref 0.0–1.2)
Bilirubin, Direct: 0.22 mg/dL (ref 0.00–0.40)
Total Protein: 7.1 g/dL (ref 6.0–8.5)

## 2019-01-25 LAB — LIPID PANEL
Chol/HDL Ratio: 4.4 ratio (ref 0.0–4.4)
Cholesterol, Total: 202 mg/dL — ABNORMAL HIGH (ref 100–199)
HDL: 46 mg/dL (ref >39)
LDL Calculated: 116 mg/dL — ABNORMAL HIGH (ref 0–99)
Triglycerides: 200 mg/dL — ABNORMAL HIGH (ref 0–149)
VLDL Cholesterol Cal: 40 mg/dL (ref 5–40)

## 2019-01-25 LAB — HEMOGLOBIN A1C
Est. average glucose Bld gHb Est-mCnc: 100 mg/dL
Hgb A1c MFr Bld: 5.1 % (ref 4.8–5.6)

## 2019-01-25 LAB — LIPOPROTEIN A (LPA): Lipoprotein (a): 23.2 nmol/L (ref <75.0)

## 2019-01-25 LAB — HIGH SENSITIVITY CRP: CRP, High Sensitivity: 2.94 mg/L (ref 0.00–3.00)

## 2019-01-26 ENCOUNTER — Telehealth: Payer: Self-pay | Admitting: Interventional Cardiology

## 2019-01-26 DIAGNOSIS — E785 Hyperlipidemia, unspecified: Secondary | ICD-10-CM

## 2019-01-26 MED ORDER — ATORVASTATIN CALCIUM 40 MG PO TABS: 40.0000 mg | ORAL_TABLET | Freq: Every day | ORAL | 3 refills | Status: DC

## 2019-01-26 NOTE — Telephone Encounter (Signed)
New Message                 Patient is calling back to get lab results, pls call to advise.

## 2019-01-26 NOTE — Telephone Encounter (Signed)
Called and spoke to patient and made her aware of results and recommendations. Patient will increase her atorvastatin to 40 mg QD and will have fasting LIPIDS and LFTs on 8/18. Educated patient on importance of exercise and following a heart healthy diet. Patient verbalized understanding and thanked me for the call.

## 2019-01-26 NOTE — Telephone Encounter (Signed)
-----   Message from Belva Crome, MD sent at 01/25/2019  2:04 PM EDT ----- Let the patient know after looking at all data, I recommend we do better with LDL, trying to achieve 70 or less. Hs CRP is upper normal and will be helped as well. Therefore I would recommend Atorvastatin 40 mg daily or Rosuvastatin 20 mg daily with Liver and lipid in 2 months. Exercise and diet are also important. Let me know if concerns. I know the coronary CT is "good" but burden of elevated cholesterol over time could impact in a negative way. A copy will be sent to Jani Gravel, MD

## 2019-03-28 ENCOUNTER — Other Ambulatory Visit: Payer: Self-pay

## 2019-03-28 ENCOUNTER — Other Ambulatory Visit: Payer: BC Managed Care – PPO

## 2019-03-28 DIAGNOSIS — E785 Hyperlipidemia, unspecified: Secondary | ICD-10-CM

## 2019-03-28 LAB — HEPATIC FUNCTION PANEL
ALT: 13 IU/L (ref 0–32)
AST: 15 IU/L (ref 0–40)
Albumin: 4.5 g/dL (ref 3.8–4.9)
Alkaline Phosphatase: 114 IU/L (ref 39–117)
Bilirubin Total: 1.5 mg/dL — ABNORMAL HIGH (ref 0.0–1.2)
Bilirubin, Direct: 0.3 mg/dL (ref 0.00–0.40)
Total Protein: 6.7 g/dL (ref 6.0–8.5)

## 2019-03-28 LAB — LIPID PANEL
Chol/HDL Ratio: 4.4 ratio (ref 0.0–4.4)
Cholesterol, Total: 158 mg/dL (ref 100–199)
HDL: 36 mg/dL — ABNORMAL LOW (ref >39)
LDL Calculated: 84 mg/dL (ref 0–99)
Triglycerides: 191 mg/dL — ABNORMAL HIGH (ref 0–149)
VLDL Cholesterol Cal: 38 mg/dL (ref 5–40)

## 2019-03-31 ENCOUNTER — Other Ambulatory Visit: Payer: Self-pay | Admitting: *Deleted

## 2019-06-28 ENCOUNTER — Telehealth: Payer: Self-pay | Admitting: *Deleted

## 2019-06-28 NOTE — Telephone Encounter (Signed)
Pt called to find out how long it would be before getting her test result of covid -19. She stated she had it done at a [facility] on Sunday and still no results. She has lost her sense of taste. She is advised to call the the [facility] regarding her results. She voiced understanding.

## 2019-09-18 DIAGNOSIS — U071 COVID-19: Secondary | ICD-10-CM | POA: Insufficient documentation

## 2019-10-31 ENCOUNTER — Other Ambulatory Visit: Payer: Self-pay | Admitting: Interventional Cardiology

## 2020-06-17 IMAGING — CT CT HEAR MORPH WITH CTA COR WITH SCORE WITH CA WITH CONTRAST AND
4 of 7 series · 8 of 20 positions shown, 9 images · IV contrast (APPLIED)
Comparison: None.
COMPARISON: None.

Addendum:
EXAM:
OVER-READ INTERPRETATION  CT CHEST

The following report is an over-read performed by radiologist Dr.
Tjahjo Hsmusic [REDACTED] on 01/18/2019. This over-read
does not include interpretation of cardiac or coronary anatomy or
pathology. The coronary CTA interpretation by the cardiologist is
attached.
CLINICAL DATA: 52-year-old female with h/o hyperlipidemia and chest
pain.
Cardiac/Coronary  CT
TECHNIQUE: The patient was scanned on a [employer] Force scanner.

[Series 6: best diast 77 % · axial · 0.39mm/px · z∈[+1005,+1042]mm · 2 of 283 slices shown, 3 images]
[im 95/283  vessel]
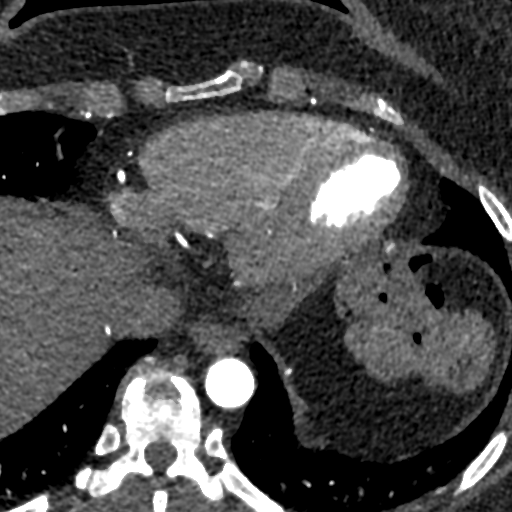
[im 95/283  lung]
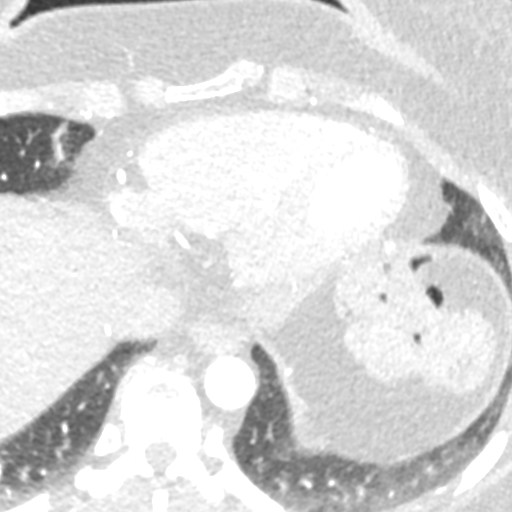
[im 189/283  vessel]
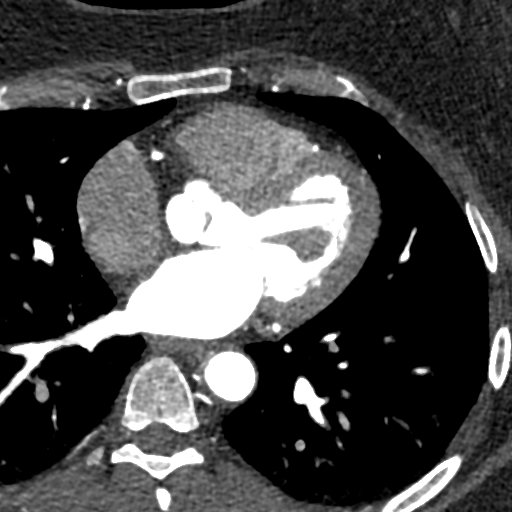

[Series 7: best syst 38 % · axial · 0.39mm/px · z∈[+1005,+1042]mm · 2 of 283 slices shown]
[im 95/283  vessel]
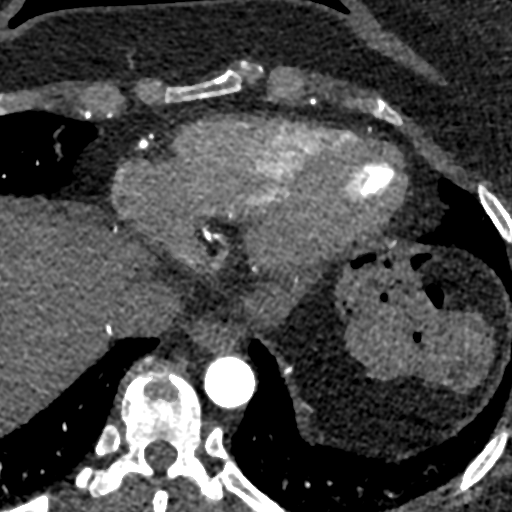
[im 189/283  vessel]
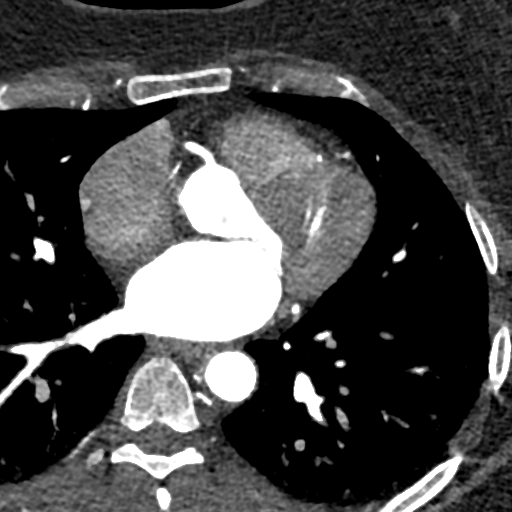

[Series 8: ts diast sharp 77 % · axial · 0.39mm/px · z∈[+1005,+1042]mm · 2 of 283 slices shown]
[im 95/283  lung]
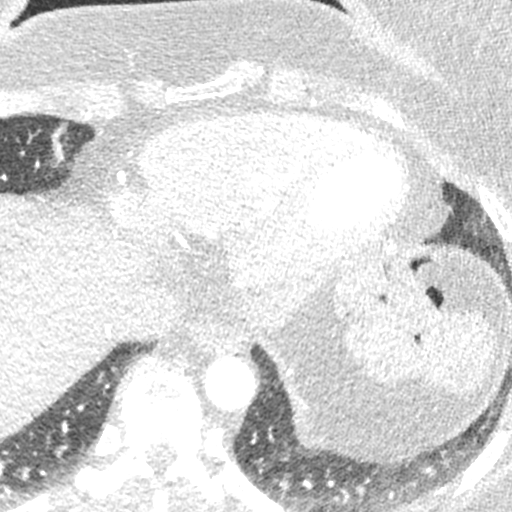
[im 189/283  lung]
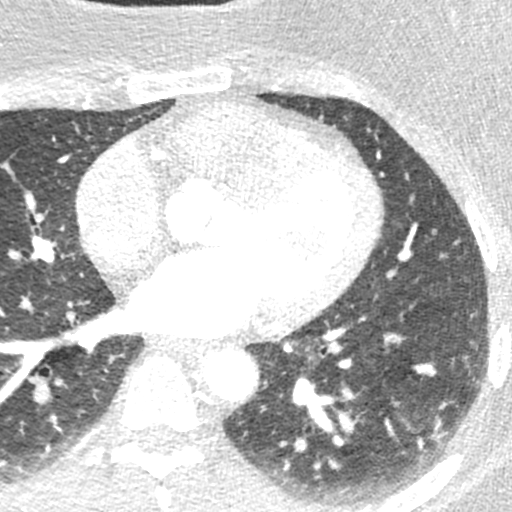

[Series 9: ts syst sharp 38 % · axial · 0.39mm/px · z∈[+1005,+1042]mm · 2 of 283 slices shown]
[im 95/283  lung]
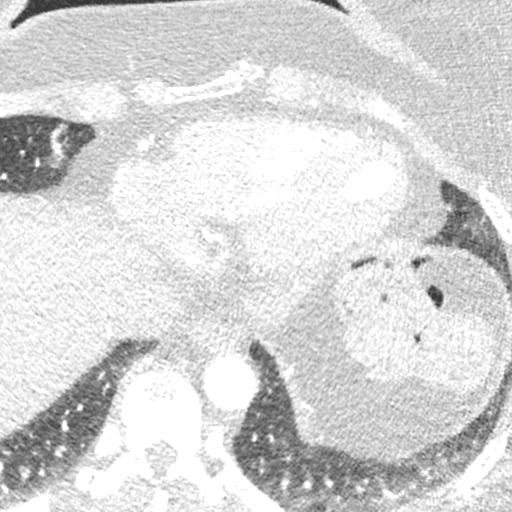
[im 189/283  lung]
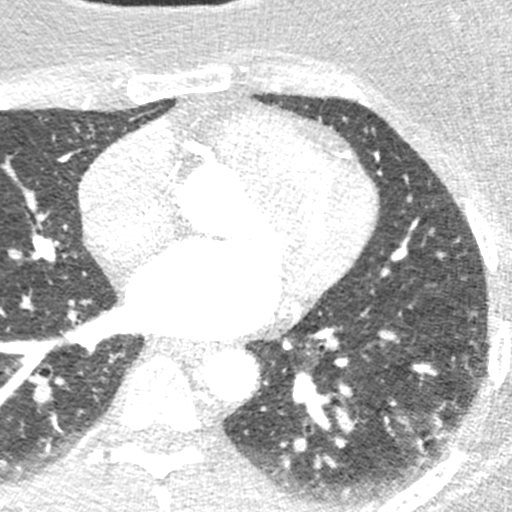

[8 of 20 positions shown; findings below may reference images not displayed]

FINDINGS: Vascular: Aortic atherosclerosis. No central pulmonary embolism, on
this non-dedicated study.

Mediastinum/Nodes: No imaged thoracic adenopathy.

Lungs/Pleura: No imaged pleural fluid. Subsegmental atelectasis in
the central left lower lobe.

Upper Abdomen: Normal imaged portions of the liver, spleen, stomach.

Musculoskeletal: No acute osseous abnormality.
IMPRESSION: 1.  No acute findings in the imaged extracardiac chest.
2.  Aortic Atherosclerosis (QZAQ9-72S.S).
FINDINGS: A 120 kV prospective scan was triggered in the descending thoracic
aorta at 111 HU's. Axial non-contrast 3 mm slices were carried out
through the heart. The data set was analyzed on a dedicated work
station and scored using the Agatson method. Gantry rotation speed
was 250 msecs and collimation was .6 mm. No beta blockade and 0.8 mg
of sl NTG was given. The 3D data set was reconstructed in 5%
intervals of the 67-82 % of the R-R cycle. Diastolic phases were
analyzed on a dedicated work station using MPR, MIP and VRT modes.
The patient received 80 cc of contrast.

Aorta: Normal size. Trivial atherosclerotic plaque and no
calcifications. No dissection.

Aortic Valve:  Trileaflet.  No calcifications.

Coronary Arteries:  Normal coronary origin.  Right dominance.

RCA is a large dominant artery that gives rise to PDA and PLVB.
There is no plaque.

Left main is a large artery that gives rise to LAD and LCX arteries.
Left main has no plaque.

LAD is a large vessel that gives rise to two diagonal arteries and
has no plaque.

LCX is a non-dominant artery that gives rise to one large OM1
branch. There is no plaque.

Other findings:

Normal pulmonary vein drainage into the left atrium.

Normal let atrial appendage without a thrombus.

Normal size of the pulmonary artery.
IMPRESSION: 1. Coronary calcium score of 0. This was 0 percentile for age and
sex matched control.

2. Normal coronary origin with right dominance.

3. No evidence of CAD.  Consider non-cardiac causes of chest pain.

*** End of Addendum ***
EXAM:
OVER-READ INTERPRETATION  CT CHEST

The following report is an over-read performed by radiologist Dr.
Tjahjo Hsmusic [REDACTED] on 01/18/2019. This over-read
does not include interpretation of cardiac or coronary anatomy or
pathology. The coronary CTA interpretation by the cardiologist is
attached.
FINDINGS: Vascular: Aortic atherosclerosis. No central pulmonary embolism, on
this non-dedicated study.

Mediastinum/Nodes: No imaged thoracic adenopathy.

Lungs/Pleura: No imaged pleural fluid. Subsegmental atelectasis in
the central left lower lobe.

Upper Abdomen: Normal imaged portions of the liver, spleen, stomach.

Musculoskeletal: No acute osseous abnormality.
IMPRESSION: 1.  No acute findings in the imaged extracardiac chest.
2.  Aortic Atherosclerosis (QZAQ9-72S.S).

## 2021-08-25 ENCOUNTER — Encounter: Payer: Self-pay | Admitting: Physician Assistant

## 2021-09-16 ENCOUNTER — Ambulatory Visit: Payer: BC Managed Care – PPO | Admitting: Physician Assistant

## 2021-09-16 ENCOUNTER — Encounter: Payer: Self-pay | Admitting: Physician Assistant

## 2021-09-16 VITALS — BP 102/72 | HR 80 | Ht 61.0 in | Wt 186.0 lb

## 2021-09-16 DIAGNOSIS — K5909 Other constipation: Secondary | ICD-10-CM

## 2021-09-16 DIAGNOSIS — Z800 Family history of malignant neoplasm of digestive organs: Secondary | ICD-10-CM

## 2021-09-16 MED ORDER — NA SULFATE-K SULFATE-MG SULF 17.5-3.13-1.6 GM/177ML PO SOLN: 1.0000 | Freq: Once | ORAL | 0 refills | Status: AC

## 2021-09-16 MED ORDER — LINACLOTIDE 145 MCG PO CAPS: 145.0000 ug | ORAL_CAPSULE | Freq: Every day | ORAL | 11 refills | Status: DC

## 2021-09-16 NOTE — Progress Notes (Signed)
Subjective:    Patient ID: Alexis Carlson, female    DOB: [birth year], 56 y.o.   MRN: [id number]  HPI Alexis Carlson is a pleasant 56 year old white female, new to GI today referred by Janie Morning, DO for screening colonoscopy. Patient has family history of colon cancer diagnosed in her father in his 84s, and brother with history of colon polyps. Patient had been seen here in 2009 and had colonoscopy with Dr. Sharlett Iles which was a normal exam and she was advised to have follow-up in 5 years. Patient says she knows that she has had 2 colonoscopies but does not recall whether the second 1 was done before 2009 or not.  She does not recall going to any other GI practices for colonoscopy, and is not certain that she has had one since 2009. She has no current GI complaints other than chronic long-term constipation.  No complaints of abdominal pain, no melena or hematochezia.  She says that when she is good about taking Benefiber every day her bowel movements will improve but at times she will go as long as 2 weeks without a bowel movement and often feels bloated and uncomfortable.  She had tried MiraLAX in the past but found that to be unpredictable.  Patient is in generally good health, history of hypertension and hyperlipidemia, depression.  Review of Systems Pertinent positive and negative review of systems were noted in the above HPI section.  All other review of systems was otherwise negative.   Outpatient Encounter Medications as of 09/16/2021  Medication Sig   aspirin EC 81 MG tablet Take 81 mg by mouth daily.   Bempedoic Acid (NEXLETOL) 180 MG TABS Take 180 mg by mouth daily.   linaclotide (LINZESS) 145 MCG CAPS capsule Take 1 capsule (145 mcg total) by mouth daily before breakfast.   Na Sulfate-K Sulfate-Mg Sulf 17.5-3.13-1.6 GM/177ML SOLN Take 1 kit by mouth once for 1 dose.   Omega-3 Fatty Acids (FISH OIL) 1000 MG CAPS Take by mouth daily.   venlafaxine XR (EFFEXOR-XR) 75 MG 24 hr capsule 1 capsule  with food   [DISCONTINUED] atorvastatin (LIPITOR) 40 MG tablet Take 1 tablet (40 mg total) by mouth daily. Please call and schedule overdue follow up appt for further refills. [phone]   [DISCONTINUED] metoprolol tartrate (LOPRESSOR) 100 MG tablet Take one tablet by mouth 2 hours prior to your CT   [DISCONTINUED] venlafaxine XR (EFFEXOR-XR) 75 MG 24 hr capsule Take 75 mg by mouth daily.    No facility-administered encounter medications on file as of 09/16/2021.   No Known Allergies Patient Active Problem List   Diagnosis Date Noted   Depression (emotion) 05/22/2014   Allergic rhinitis 05/22/2014   Social History   Socioeconomic History   Marital status: Divorced    Spouse name: Not on file   Number of children: Not on file   Years of education: Not on file   Highest education level: Not on file  Occupational History   Not on file  Tobacco Use   Smoking status: Never   Smokeless tobacco: Never  Vaping Use   Vaping Use: Never used  Substance and Sexual Activity   Alcohol use: Yes    Comment: social   Drug use: No   Sexual activity: Not on file  Other Topics Concern   Not on file  Social History Narrative   Not on file   Social Determinants of Health   Financial Resource Strain: Not on file  Food Insecurity: Not on file  Transportation Needs: Not on file  Physical Activity: Not on file  Stress: Not on file  Social Connections: Not on file  Intimate Partner Violence: Not on file    Alexis Carlson's family history includes Aneurysm in her father; Cancer - Colon (age of onset: 31) in her father; Colon polyps in her brother; Heart disease in her brother, father, mother, and sister; Hyperlipidemia in her paternal uncle; Hypertension in her brother; Other in her mother; Pancreatic cancer in her maternal uncle.      Objective:    Vitals:   09/16/21 1418  BP: 102/72  Pulse: 80  SpO2: 98%    Physical Exam. Well-developed well-nourished white female in no acute  distress.,  Pleasant  Weight, 186 BMI 35.1  HEENT; nontraumatic normocephalic, EOMI, PE R LA, sclera anicteric. Oropharynx; not examined today Neck; supple, no JVD Cardiovascular; regular rate and rhythm with S1-S2, no murmur rub or gallop Pulmonary; Clear bilaterally Abdomen; soft, nontender, nondistended, no palpable mass or hepatosplenomegaly, bowel sounds are active Rectal; not done today Skin; benign exam, no jaundice rash or appreciable lesions Extremities; no clubbing cyanosis or edema skin warm and dry Neuro/Psych; alert and oriented x4, grossly nonfocal mood and affect appropriate        Assessment & Plan:   #18 56 year old white female with family history of colon cancer in her father diagnosed in his 84s, brother with colon polyps, here for screening colonoscopy. Last colonoscopy 2009 normal.  #2 chronic longstanding constipation-sometimes going as long as 2 weeks between bowel movements, associated abdominal bloating and discomfort  Plan; patient will be scheduled for colonoscopy with Dr. Rush Landmark.  Procedure was discussed in detail with patient including indications risks and benefits and she is agreeable to proceed We will start trial of Linzess 145 mcg daily, she was given samples today and prescription sent.  I have asked her to call back in a couple of weeks if she is not having good results, and we can either increase the dose or try an alternative medication. We discussed increasing fiber in diet and drinking at least 60 ounces of water per day in addition to above.  Chuck Caban Genia Harold PA-C 09/16/2021   Cc: Jani Gravel, MD

## 2021-09-16 NOTE — Patient Instructions (Signed)
If you are age 56 or younger, your body mass index should be between 19-25. Your Body mass index is 35.14 kg/m. If this is out of the aformentioned range listed, please consider follow up with your Primary Care Provider.  ________________________________________________________  The [facility] providers would like to encourage you to use [facility] to communicate with providers for non-urgent requests or questions.  Due to long hold times on the telephone, sending your provider a message by [facility] may be a faster and more efficient way to get a response.  Please allow 48 business hours for a response.  Please remember that this is for non-urgent requests.  _______________________________________________________  Alexis Carlson have been scheduled for a colonoscopy. Please follow written instructions given to you at your visit today.  Please pick up your prep supplies at the pharmacy within the next 1-3 days. If you use inhalers (even only as needed), please bring them with you on the day of your procedure.  START Linzess 145 mcg 1 capsule prior to breakfast. We have sent this to your pharmacy along with giving you samples.  Please try to drink 60 ounces of water daily.  Follow up pending the results of your endoscopy.  Thank you for entrusting me with your care and choosing [facility].  Amy Esterwood, PA-C

## 2021-09-17 NOTE — Progress Notes (Signed)
Attending Physician's Attestation   I have reviewed the chart.   I agree with the Advanced Practitioner's note, impression, and recommendations with any updates as below.    Laquanda Bick Mansouraty, MD [facility] Advanced Endoscopy Office # [phone]  

## 2021-10-16 ENCOUNTER — Encounter: Payer: Self-pay | Admitting: Gastroenterology

## 2021-10-21 ENCOUNTER — Encounter: Payer: Self-pay | Admitting: Gastroenterology

## 2021-10-21 ENCOUNTER — Ambulatory Visit (AMBULATORY_SURGERY_CENTER): Payer: BC Managed Care – PPO | Admitting: Gastroenterology

## 2021-10-21 VITALS — BP 111/72 | HR 68 | Temp 97.7°F | Resp 15 | Ht 61.0 in | Wt 186.0 lb

## 2021-10-21 DIAGNOSIS — Z1211 Encounter for screening for malignant neoplasm of colon: Secondary | ICD-10-CM | POA: Diagnosis not present

## 2021-10-21 DIAGNOSIS — Z8371 Family history of colonic polyps: Secondary | ICD-10-CM | POA: Diagnosis not present

## 2021-10-21 DIAGNOSIS — Z800 Family history of malignant neoplasm of digestive organs: Secondary | ICD-10-CM

## 2021-10-21 DIAGNOSIS — D123 Benign neoplasm of transverse colon: Secondary | ICD-10-CM

## 2021-10-21 DIAGNOSIS — K5909 Other constipation: Secondary | ICD-10-CM

## 2021-10-21 MED ORDER — SODIUM CHLORIDE 0.9 % IV SOLN: 500.0000 mL | Freq: Once | INTRAVENOUS | Status: DC

## 2021-10-21 MED ORDER — LINZESS 145 MCG PO CAPS: 145.0000 ug | ORAL_CAPSULE | Freq: Every day | ORAL | 12 refills | Status: DC

## 2021-10-21 NOTE — Progress Notes (Signed)
VS-CW  Pt's states no medical or surgical changes since previsit or office visit.  

## 2021-10-21 NOTE — Op Note (Signed)
[place] ?Patient Name: Alexis Carlson ?Procedure Date: 10/21/2021 10:25 AM ?MRN: [id number] ?Endoscopist: Justice Britain , MD ?Age: 56 ?Referring MD:  ?Date of Birth: 11/11/1965 ?Gender: Female ?Account #: [id number] ?Procedure:                Colonoscopy ?Indications:              Colon cancer screening in patient at increased  ?                          risk: Family history of 1st-degree relative with  ?                          colon polyps before age 88 years, Screening patient  ?                          at increased risk: Family history of 1st-degree  ?                          relative with colorectal cancer at age 40 years (or  ?                          older) ?Medicines:                Monitored Anesthesia Care ?Procedure:                Pre-Anesthesia Assessment: ?                          - Prior to the procedure, a History and Physical  ?                          was performed, and patient medications and  ?                          allergies were reviewed. The patient's tolerance of  ?                          previous anesthesia was also reviewed. The risks  ?                          and benefits of the procedure and the sedation  ?                          options and risks were discussed with the patient.  ?                          All questions were answered, and informed consent  ?                          was obtained. Prior Anticoagulants: The patient has  ?                          taken no previous anticoagulant or antiplatelet  ?  agents except for aspirin. ASA Grade Assessment: II  ?                          - A patient with mild systemic disease. After  ?                          reviewing the risks and benefits, the patient was  ?                          deemed in satisfactory condition to undergo the  ?                          procedure. ?                          After obtaining informed consent, the colonoscope  ?                          was  passed under direct vision. Throughout the  ?                          procedure, the patient's blood pressure, pulse, and  ?                          oxygen saturations were monitored continuously. The  ?                          CF [id number] #[id number] was introduced through the anus  ?                          and advanced to the the cecum, identified by  ?                          appendiceal orifice and ileocecal valve. The  ?                          colonoscopy was performed without difficulty. The  ?                          patient tolerated the procedure. The quality of the  ?                          bowel preparation was poor. The terminal ileum,  ?                          ileocecal valve, appendiceal orifice, and rectum  ?                          were photographed. ?Scope In: 10:41:17 AM ?Scope Out: 10:52:19 AM ?Scope Withdrawal Time: 0 hours 7 minutes 11 seconds  ?Total Procedure Duration: 0 hours 11 minutes 2 seconds  ?Findings:                 The digital rectal exam was normal. Pertinent  ?  negatives include no palpable rectal lesions. ?                          Extensive amounts of semi-liquid semi-solid stool  ?                          was found in the entire colon, interfering with  ?                          visualization. Lavage of the area was performed  ?                          using copious amounts, resulting in incomplete  ?                          clearance with continued poor visualization. ?                          The terminal ileum and ileocecal valve appeared  ?                          normal. ?                          A 7 mm polyp was found in the transverse colon. The  ?                          polyp was sessile. The polyp was removed with a  ?                          cold snare. Resection and retrieval were complete. ?                          A few small-mouthed diverticula were found in the  ?                          recto-sigmoid colon and sigmoid  colon. ?                          Non-bleeding non-thrombosed internal hemorrhoids  ?                          were found during retroflexion, during perianal  ?                          exam and during digital exam. The hemorrhoids were  ?                          Grade I (internal hemorrhoids that do not prolapse). ?Complications:            No immediate complications. ?Estimated Blood Loss:     Estimated blood loss was minimal. ?Impression:               - Preparation of the colon was poor. ?                          -  The examined portion of the ileum was normal. ?                          - One 7 mm polyp in the transverse colon, removed  ?                          with a cold snare. Resected and retrieved. ?                          - Diverticulosis in the recto-sigmoid colon and in  ?                          the sigmoid colon. ?                          - Non-bleeding non-thrombosed internal hemorrhoids. ?Recommendation:           - The patient will be observed post-procedure,  ?                          until all discharge criteria are met. ?                          - Discharge patient to home. ?                          - Patient has a contact number available for  ?                          emergencies. The signs and symptoms of potential  ?                          delayed complications were discussed with the  ?                          patient. Return to normal activities tomorrow.  ?                          Written discharge instructions were provided to the  ?                          patient. ?                          - High fiber diet. ?                          - Use FiberCon 1-2 tablets PO daily. ?                          - Continue present medications. ?                          - Start Linzess 145 mcg daily. She had some  ?  effectiveness with the use of samples previously.  ?                          Hopefully this will help her in the long term (has  ?                           constipation with using restroom only once weekly  ?                          currently). ?                          - Await pathology results. ?                          - Repeat colonoscopy in next 6-12 months because  ?                          the bowel preparation was suboptimal. 2-day  ?                          preparation and hopefully Linzess will help with  ?                          this in the future as well. If she wants to be seen  ?                          in follow up clinic as well to see how Linzess is  ?                          doing she can have that arranged in the next 6-8  ?                          weeks. ?                          - The findings and recommendations were discussed  ?                          with the patient. ?                          - The findings and recommendations were discussed  ?                          with the designated responsible adult. ?Justice Britain, MD ?10/21/2021 11:00:38 AM ?

## 2021-10-21 NOTE — Progress Notes (Signed)
? ?GASTROENTEROLOGY PROCEDURE H&P NOTE  ? ?Primary Care Physician: ?Janie Morning, DO ? ?HPI: ?Zoe Goonan is a 56 y.o. female who presents for Colonoscopy for high risk screening with family history of colon cancer. ? ?Past Medical History:  ?Diagnosis Date  ? Abnormal glucose   ? Allergic rhinitis 05/22/2014  ? Anxiety   ? Chest pain   ? Depression (emotion) 05/22/2014  ? Dyslipidemia   ? FH: heart disease   ? ?Past Surgical History:  ?Procedure Laterality Date  ? CESAREAN SECTION    ? child birth  ? ?Current Outpatient Medications  ?Medication Sig Dispense Refill  ? aspirin EC 81 MG tablet Take 81 mg by mouth daily.    ? Bempedoic Acid (NEXLETOL) 180 MG TABS Take 180 mg by mouth daily.    ? linaclotide (LINZESS) 145 MCG CAPS capsule Take 1 capsule (145 mcg total) by mouth daily before breakfast. 30 capsule 11  ? Omega-3 Fatty Acids (FISH OIL) 1000 MG CAPS Take by mouth daily.    ? venlafaxine XR (EFFEXOR-XR) 75 MG 24 hr capsule 1 capsule with food    ? ?No current facility-administered medications for this visit.  ? ? ?Current Outpatient Medications:  ?  aspirin EC 81 MG tablet, Take 81 mg by mouth daily., Disp: , Rfl:  ?  Bempedoic Acid (NEXLETOL) 180 MG TABS, Take 180 mg by mouth daily., Disp: , Rfl:  ?  linaclotide (LINZESS) 145 MCG CAPS capsule, Take 1 capsule (145 mcg total) by mouth daily before breakfast., Disp: 30 capsule, Rfl: 11 ?  Omega-3 Fatty Acids (FISH OIL) 1000 MG CAPS, Take by mouth daily., Disp: , Rfl:  ?  venlafaxine XR (EFFEXOR-XR) 75 MG 24 hr capsule, 1 capsule with food, Disp: , Rfl:  ?No Known Allergies ?Family History  ?Problem Relation Age of Onset  ? Heart disease Mother   ? Other Mother   ?     ddd  ? Cancer - Colon Father 5  ? Heart disease Father   ? Aneurysm Father   ?     brain  ? Heart disease Sister   ? Hypertension Brother   ? Heart disease Brother   ?     cad s/p stents  ? Colon polyps Brother   ? Pancreatic cancer Maternal Uncle   ? Hyperlipidemia Paternal Uncle   ? Stomach  cancer Neg Hx   ? Esophageal cancer Neg Hx   ? ?Social History  ? ?Socioeconomic History  ? Marital status: Divorced  ?  Spouse name: Not on file  ? Number of children: Not on file  ? Years of education: Not on file  ? Highest education level: Not on file  ?Occupational History  ? Not on file  ?Tobacco Use  ? Smoking status: Never  ? Smokeless tobacco: Never  ?Vaping Use  ? Vaping Use: Never used  ?Substance and Sexual Activity  ? Alcohol use: Yes  ?  Comment: social  ? Drug use: No  ? Sexual activity: Not on file  ?Other Topics Concern  ? Not on file  ?Social History Narrative  ? Not on file  ? ?Social Determinants of Health  ? ?Financial Resource Strain: Not on file  ?Food Insecurity: Not on file  ?Transportation Needs: Not on file  ?Physical Activity: Not on file  ?Stress: Not on file  ?Social Connections: Not on file  ?Intimate Partner Violence: Not on file  ? ? ?Physical Exam: ?There were no vitals filed for this visit. ?There  is no height or weight on file to calculate BMI. ?GEN: NAD ?EYE: Sclerae anicteric ?ENT: MMM ?CV: Non-tachycardic ?GI: Soft, NT/ND ?NEURO:  Alert & Oriented x 3 ? ?Lab Results: ?No results for input(s): WBC, HGB, HCT, PLT in the last 72 hours. ?BMET ?No results for input(s): NA, K, CL, CO2, GLUCOSE, BUN, CREATININE, CALCIUM in the last 72 hours. ?LFT ?No results for input(s): PROT, ALBUMIN, AST, ALT, ALKPHOS, BILITOT, BILIDIR, IBILI in the last 72 hours. ?PT/INR ?No results for input(s): LABPROT, INR in the last 72 hours. ? ? ?Impression / Plan: ?This is a 56 y.o.female who presents for Colonoscopy for high risk screening with family history of colon cancer. ? ?The risks and benefits of endoscopic evaluation/treatment were discussed with the patient and/or family; these include but are not limited to the risk of perforation, infection, bleeding, missed lesions, lack of diagnosis, severe illness requiring hospitalization, as well as anesthesia and sedation related illnesses.  The patient's  history has been reviewed, patient examined, no change in status, and deemed stable for procedure.  The patient and/or family is agreeable to proceed.  ? ? ?Justice Britain, MD ?[facility] ?Advanced Endoscopy ?Office # [phone] ? ?

## 2021-10-21 NOTE — Progress Notes (Signed)
PT taken to PACU. Monitors in place. VSS. Report given to RN. 

## 2021-10-21 NOTE — Patient Instructions (Signed)
Handouts provided on polyps, diverticulosis and hemorrhoids.  ? ?Recommend a high-fiber diet (see handout). ?Use FiberCon 1-2 tablets by mouth daily.  ? ?Start Linzess 114mg by mouth daily.  ? ?Repeat colonoscopy in next 6-12 months with a 2 day prep.  ? ?YOU [facility]IC [facility]EDURE [facility]:   Refer to the procedure report that was given to you for any specific questions about what was found during the examination.  If the procedure report does not answer your questions, please call your gastroenterologist to clarify.  If you requested that your care partner not be given the details of your procedure findings, then the procedure report has been included in a sealed envelope for you to review at your convenience later. ? ?YOU SHOULD EXPECT: Some feelings of bloating in the abdomen. Passage of more gas than usual.  Walking can help get rid of the air that was put into your GI tract during the procedure and reduce the bloating. If you had a lower endoscopy (such as a colonoscopy or flexible sigmoidoscopy) you may notice spotting of blood in your stool or on the toilet paper. If you underwent a bowel prep for your procedure, you may not have a normal bowel movement for a few days. ? ?Please Note:  You might notice some irritation and congestion in your nose or some drainage.  This is from the oxygen used during your procedure.  There is no need for concern and it should clear up in a day or so. ? ?SYMPTOMS TO REPORT IMMEDIATELY: ? ?Following lower endoscopy (colonoscopy or flexible sigmoidoscopy): ? Excessive amounts of blood in the stool ? Significant tenderness or worsening of abdominal pains ? Swelling of the abdomen that is new, acute ? Fever of 100?F or higher ? ?For urgent or emergent issues, a gastroenterologist can be reached at any hour by calling ([phone] ?Do not use MyChart messaging for urgent concerns.  ? ? ?DIET:  We do recommend a small meal at first, but then you  may proceed to your regular diet.  Drink plenty of fluids but you should avoid alcoholic beverages for 24 hours. ? ?ACTIVITY:  You should plan to take it easy for the rest of today and you should NOT DRIVE or use heavy machinery until tomorrow (because of the sedation medicines used during the test).   ? ?FOLLOW UP: ?Our staff will call the number listed on your records 48-72 hours following your procedure to check on you and address any questions or concerns that you may have regarding the information given to you following your procedure. If we do not reach you, we will leave a message.  We will attempt to reach you two times.  During this call, we will ask if you have developed any symptoms of COVID 19. If you develop any symptoms (ie: fever, flu-like symptoms, shortness of breath, cough etc.) before then, please call ([phone]  If you test positive for Covid 19 in the 2 weeks post procedure, please call and report this information to [facility]   ? ?If any biopsies were taken you will be contacted by phone or by letter within the next 1-3 weeks.  Please call [facility] ([phone] you have not heard about the biopsies in 3 weeks.  ? ? ?SIGNATURES/CONFIDENTIALITY: ?You and/or your care partner have signed paperwork which will be entered into your electronic medical record.  These signatures attest to the fact that that the information above on your  After Visit Summary has been reviewed and is understood.  Full responsibility of the confidentiality of this discharge information lies with you and/or your care-partner. ? ?

## 2021-10-21 NOTE — Progress Notes (Signed)
Called to room to assist during endoscopic procedure.  Patient ID and intended procedure confirmed with present staff. Received instructions for my participation in the procedure from the performing physician.  

## 2021-10-23 ENCOUNTER — Telehealth: Payer: Self-pay | Admitting: *Deleted

## 2021-10-23 ENCOUNTER — Telehealth: Payer: Self-pay

## 2021-10-23 NOTE — Telephone Encounter (Signed)
?  Follow up Call-voicemail left. ? ? ? ?

## 2021-10-23 NOTE — Telephone Encounter (Signed)
Left message on follow up call. 

## 2021-10-28 ENCOUNTER — Encounter: Payer: Self-pay | Admitting: Gastroenterology

## 2021-11-13 ENCOUNTER — Encounter: Payer: Self-pay | Admitting: Podiatry

## 2021-11-13 ENCOUNTER — Ambulatory Visit (INDEPENDENT_AMBULATORY_CARE_PROVIDER_SITE_OTHER): Payer: BC Managed Care – PPO

## 2021-11-13 ENCOUNTER — Other Ambulatory Visit: Payer: Self-pay | Admitting: Podiatry

## 2021-11-13 ENCOUNTER — Ambulatory Visit: Payer: BC Managed Care – PPO | Admitting: Podiatry

## 2021-11-13 DIAGNOSIS — M722 Plantar fascial fibromatosis: Secondary | ICD-10-CM | POA: Diagnosis not present

## 2021-11-13 DIAGNOSIS — F419 Anxiety disorder, unspecified: Secondary | ICD-10-CM | POA: Insufficient documentation

## 2021-11-13 DIAGNOSIS — K5900 Constipation, unspecified: Secondary | ICD-10-CM | POA: Insufficient documentation

## 2021-11-13 DIAGNOSIS — E785 Hyperlipidemia, unspecified: Secondary | ICD-10-CM | POA: Insufficient documentation

## 2021-11-13 DIAGNOSIS — E7800 Pure hypercholesterolemia, unspecified: Secondary | ICD-10-CM | POA: Insufficient documentation

## 2021-11-13 DIAGNOSIS — E669 Obesity, unspecified: Secondary | ICD-10-CM | POA: Insufficient documentation

## 2021-11-13 DIAGNOSIS — M7750 Other enthesopathy of unspecified foot: Secondary | ICD-10-CM

## 2021-11-13 MED ORDER — MELOXICAM 15 MG PO TABS: 15.0000 mg | ORAL_TABLET | Freq: Every day | ORAL | 3 refills | Status: DC

## 2021-11-13 MED ORDER — TRIAMCINOLONE ACETONIDE 40 MG/ML IJ SUSP
20.0000 mg | Freq: Once | INTRAMUSCULAR | Status: AC
  Administered 2021-11-13: 20 mg

## 2021-11-13 MED ORDER — METHYLPREDNISOLONE 4 MG PO TBPK: ORAL_TABLET | ORAL | 0 refills | Status: DC

## 2021-11-13 NOTE — Patient Instructions (Signed)

## 2021-11-15 NOTE — Progress Notes (Signed)
?Subjective:  ?Patient ID: Alexis Carlson, female    DOB: 1966-03-13,  MRN: [id number] ?HPI ?Chief Complaint  ?Patient presents with  ? Foot Pain  ?  Plantar heel/arch left - aching x 1 year, worsened recently, AM pain, noticing radiating pain into calf, tried rest  ? New Patient (Initial Visit)  ? ? ?56 y.o. female presents with the above complaint.  ? ?ROS: Denies fever chills nausea vomiting muscle aches pains calf pain back pain chest pain shortness of breath. ? ?Past Medical History:  ?Diagnosis Date  ? Abnormal glucose   ? Allergic rhinitis 05/22/2014  ? Anxiety   ? Chest pain   ? Depression (emotion) 05/22/2014  ? Dyslipidemia   ? FH: heart disease   ? ?Past Surgical History:  ?Procedure Laterality Date  ? CESAREAN SECTION    ? child birth  ? ? ?Current Outpatient Medications:  ?  meloxicam (MOBIC) 15 MG tablet, Take 1 tablet (15 mg total) by mouth daily., Disp: 30 tablet, Rfl: 3 ?  methylPREDNISolone (MEDROL DOSEPAK) 4 MG TBPK tablet, 6 day dose pack - take as directed, Disp: 21 tablet, Rfl: 0 ?  aspirin EC 81 MG tablet, Take 81 mg by mouth daily., Disp: , Rfl:  ?  Bempedoic Acid (NEXLETOL) 180 MG TABS, Take 180 mg by mouth daily., Disp: , Rfl:  ?  LINZESS 145 MCG CAPS capsule, Take 1 capsule (145 mcg total) by mouth daily before breakfast., Disp: 30 capsule, Rfl: 12 ?  Omega-3 Fatty Acids (FISH OIL) 1000 MG CAPS, Take by mouth daily., Disp: , Rfl:  ?  venlafaxine XR (EFFEXOR-XR) 75 MG 24 hr capsule, 1 capsule with food, Disp: , Rfl:  ? ?Allergies  ?Allergen Reactions  ? Ezetimibe   ?  Other reaction(s): muscle aches in legs and hips  ? Statins Other (See Comments)  ?   aches   ? ?Review of Systems ?Objective:  ?There were no vitals filed for this visit. ? ?General: Well developed, nourished, in no acute distress, alert and oriented x3  ? ?Dermatological: Skin is warm, dry and supple bilateral. Nails x 10 are well maintained; remaining integument appears unremarkable at this time. There are no open sores, no  preulcerative lesions, no rash or signs of infection present. ? ?Vascular: Dorsalis Pedis artery and Posterior Tibial artery pedal pulses are 2/4 bilateral with immedate capillary fill time. Pedal hair growth present. No varicosities and no lower extremity edema present bilateral.  ? ?Neruologic: Grossly intact via light touch bilateral. Vibratory intact via tuning fork bilateral. Protective threshold with Semmes Wienstein monofilament intact to all pedal sites bilateral. Patellar and Achilles deep tendon reflexes 2+ bilateral. No Babinski or clonus noted bilateral.  ? ?Musculoskeletal: No gross boney pedal deformities bilateral. No pain, crepitus, or limitation noted with foot and ankle range of motion bilateral. Muscular strength 5/5 in all groups tested bilateral.  Pain on palpation medial calcaneal tubercle of the left heel.  No pain on medial-lateral compression of the calcaneus. ?Gait: Unassisted, Nonantalgic.  ? ? ?Radiographs: ? ?Radiographs taken today demonstrate an osseously mature individual small posterior and plantar calcaneal heel spur soft tissue increase in density plantar fascial Caney insertion site.  No osseous abnormalities. ? ?Assessment & Plan:  ? ?Assessment: Planter fasciitis left foot ? ?Plan: Discussed etiology pathology and surgical therapies started her on methylprednisolone to be followed by meloxicam.  Placed her in a plantar fascia brace and a night splint injected her left heel 20 mg Kenalog 5 mg Marcaine point  maximal tenderness after sterile Betadine skin prep.  Tolerated procedure well without complications.  Follow-up with her in 1 month. ? ? ? ? ?Marylynn Rigdon T. Colton, DPM ?

## 2021-12-16 ENCOUNTER — Ambulatory Visit: Payer: BC Managed Care – PPO | Admitting: Gastroenterology

## 2022-01-01 ENCOUNTER — Ambulatory Visit: Payer: BC Managed Care – PPO | Admitting: Podiatry

## 2022-01-01 DIAGNOSIS — M722 Plantar fascial fibromatosis: Secondary | ICD-10-CM

## 2022-01-04 NOTE — Progress Notes (Signed)
She presents again today states that she was doing very well and actually starting to develop a shooting pain in her foot once again states that it is a dull achy pain that shoots occasionally to the left heel radiating into the arch.  She states that the radiation pain just began in May states that she is not using the plantar fascial brace that was uncomfortable she is not using the night splint.  She denies any trauma.  Objective: Vital signs are stable alert and oriented x3.  Pulses are palpable.  She has pain on palpation to the medial calcaneal tubercle of the left heel with minimal tenderness on palpation of the medial band of the plantar fascia in the arch itself.  No pain on medial-lateral compression of the calcaneus.  No pain on palpation of the Achilles.  Assessment: Intractable Planter fasciitis.  Plan: Discussed etiology pathology and surgical therapies I once again injected today 20 mg Kenalog 5 mg Marcaine point of maximal tenderness medial aspect of the left heel.  I discussed with her the importance of wearing appropriate shoe gear which she is not a fan of doing.  She is going to continue her meloxicam on a regular basis and I did highly recommend the braces that we provided her last visit to be worn on a regular basis.  I will follow-up with her in about 1 month she will call with questions or concerns if she is demonstrates no improvement MRI will be necessary.

## 2022-01-20 ENCOUNTER — Ambulatory Visit: Payer: BC Managed Care – PPO | Admitting: Podiatry

## 2022-02-03 ENCOUNTER — Ambulatory Visit: Payer: BC Managed Care – PPO | Admitting: Podiatry

## 2022-04-30 ENCOUNTER — Ambulatory Visit: Payer: BC Managed Care – PPO | Admitting: Podiatry

## 2022-04-30 DIAGNOSIS — M722 Plantar fascial fibromatosis: Secondary | ICD-10-CM

## 2022-04-30 MED ORDER — MELOXICAM 15 MG PO TABS: 15.0000 mg | ORAL_TABLET | Freq: Every day | ORAL | 3 refills | Status: DC

## 2022-04-30 NOTE — Progress Notes (Signed)
She presents today for follow-up of her left foot pain.  She has a history of Planter fasciitis.  She needs a refill on her meloxicam.  Objective: Vital signs are stable alert oriented x3.  There is no erythema edema salines drainage noted though she does have pain on palpation medial calcaneal tubercle ossicle calcaneal tubercle of the left heel.  She has some tenderness on palpation lateral aspect of the foot and fourth fifth tarsometatarsal joint.  Assessment chronic intractable Planter fasciitis left.  Plan: Discussed etiology pathology conservative therapies.  I refilled her meloxicam and injected her heel with 20 mg Kenalog for milligrams Marcaine point maximal tenderness.  Follow-up with me on an as-needed basis.

## 2022-10-01 ENCOUNTER — Ambulatory Visit: Payer: BC Managed Care – PPO | Admitting: Podiatry

## 2022-10-08 ENCOUNTER — Ambulatory Visit: Payer: BC Managed Care – PPO | Admitting: Podiatry

## 2022-10-08 ENCOUNTER — Encounter: Payer: Self-pay | Admitting: Podiatry

## 2022-10-08 DIAGNOSIS — S93692A Other sprain of left foot, initial encounter: Secondary | ICD-10-CM

## 2022-10-08 DIAGNOSIS — M722 Plantar fascial fibromatosis: Secondary | ICD-10-CM | POA: Diagnosis not present

## 2022-10-08 NOTE — Progress Notes (Signed)
She presents today for follow-up of her Planter fasciitis of her left foot.  States that 2 weeks ago she was nearly in tears because of the pain that she was experiencing.  She states is really never got a lot better but she does not want to have to live on anti-inflammatories rest of her life.  Objective: Vital signs stable alert oriented x 3 no erythema edema cellulitis or drainage odor she has severe pain on palpation medial calcaneal tubercle of the left heel.  The medial plantar fascial band demonstrate some elasticity near the insertion of the heel.  The area is warm to the touch.  Assessment: Possible tear of the plantar fascia left heel.  Plan: Unguinal request an MRI for differential diagnosis and surgical planning on the left rear foot.  Most likely tear of the plantar fascia.

## 2022-10-13 ENCOUNTER — Ambulatory Visit: Payer: BC Managed Care – PPO | Admitting: Podiatry

## 2022-10-14 ENCOUNTER — Encounter: Payer: Self-pay | Admitting: Podiatry

## 2022-10-21 ENCOUNTER — Ambulatory Visit
Admission: RE | Admit: 2022-10-21 | Discharge: 2022-10-21 | Disposition: A | Discharge status: Home / Self Care | Payer: BC Managed Care – PPO | Source: Ambulatory Visit | Attending: Podiatry | Admitting: Podiatry

## 2022-10-21 DIAGNOSIS — S93692A Other sprain of left foot, initial encounter: Secondary | ICD-10-CM

## 2023-02-02 ENCOUNTER — Telehealth: Payer: BC Managed Care – PPO | Admitting: Physician Assistant

## 2023-02-02 DIAGNOSIS — K047 Periapical abscess without sinus: Secondary | ICD-10-CM

## 2023-02-02 MED ORDER — AMOXICILLIN-POT CLAVULANATE 875-125 MG PO TABS: 1.0000 | ORAL_TABLET | Freq: Two times a day (BID) | ORAL | 0 refills | Status: DC

## 2023-02-02 MED ORDER — ETODOLAC 200 MG PO CAPS: 200.0000 mg | ORAL_CAPSULE | Freq: Three times a day (TID) | ORAL | 0 refills | Status: AC

## 2023-02-02 NOTE — Patient Instructions (Signed)
  Lodema Pilot, thank you for joining Piedad Climes, PA-C for today's virtual visit.  While this provider is not your primary care provider (PCP), if your PCP is located in our provider database this encounter information will be shared with them immediately following your visit.   A [facility] MyChart account gives you access to today's visit and all your visits, tests, and labs performed at [facility] " click here if you don't have a [facility] MyChart account or go to [email or web address]  Consent: (Patient) Paetyn Pietrzak provided verbal consent for this virtual visit at the beginning of the encounter.  Current Medications:  Current Outpatient Medications:    aspirin EC 81 MG tablet, Take 81 mg by mouth daily., Disp: , Rfl:    Bempedoic Acid (NEXLETOL) 180 MG TABS, Take 180 mg by mouth daily., Disp: , Rfl:    LINZESS 145 MCG CAPS capsule, Take 1 capsule (145 mcg total) by mouth daily before breakfast., Disp: 30 capsule, Rfl: 12   meloxicam (MOBIC) 15 MG tablet, Take 1 tablet (15 mg total) by mouth daily., Disp: 30 tablet, Rfl: 3   meloxicam (MOBIC) 15 MG tablet, Take 1 tablet (15 mg total) by mouth daily., Disp: 30 tablet, Rfl: 3   methylPREDNISolone (MEDROL DOSEPAK) 4 MG TBPK tablet, 6 day dose pack - take as directed, Disp: 21 tablet, Rfl: 0   Omega-3 Fatty Acids (FISH OIL) 1000 MG CAPS, Take by mouth daily., Disp: , Rfl:    venlafaxine XR (EFFEXOR-XR) 75 MG 24 hr capsule, 1 capsule with food, Disp: , Rfl:    Medications ordered in this encounter:  No orders of the defined types were placed in this encounter.    *If you need refills on other medications prior to your next appointment, please contact your pharmacy*  Follow-Up: Call back or seek an in-person evaluation if the symptoms worsen or if the condition fails to improve as anticipated.  [facility] [phone]  Other Instructions Take the antibiotic as directed with food. Start the  clove paste as discussed. Start 2 x daily rinse with Peroxyl mouthwash (OTC).  OTC Tylenol as needed between doses of the Etodolac twice daily.  Follow-up with your dental provider ASAP. If anything is not improving despite treatment you need to be evaluated in person even if at local urgent care or ER. DO NOT DELAY CARE.   If you have been instructed to have an in-person evaluation today at a local Urgent Care facility, please use the link below. It will take you to a list of all of our available [facility], including address, phone number and hours of operation. Please do not delay care.  [facility]  If you or a family member do not have a primary care provider, use the link below to schedule a visit and establish care. When you choose a [facility] primary care physician or advanced practice provider, you gain a long-term partner in health. Find a Primary Care Provider  Learn more about [facility] in-office and virtual care options:  - Get Care Now

## 2023-02-02 NOTE — Progress Notes (Signed)
Virtual Visit Consent   Alexis Carlson, you are scheduled for a virtual visit with a [facility] provider today. Just as with appointments in the office, your consent must be obtained to participate. Your consent will be active for this visit and any virtual visit you may have with one of our providers in the next 365 days. If you have a MyChart account, a copy of this consent can be sent to you electronically.  As this is a virtual visit, video technology does not allow for your provider to perform a traditional examination. This may limit your provider's ability to fully assess your condition. If your provider identifies any concerns that need to be evaluated in person or the need to arrange testing (such as labs, EKG, etc.), we will make arrangements to do so. Although advances in technology are sophisticated, we cannot ensure that it will always work on either your end or our end. If the connection with a video visit is poor, the visit may have to be switched to a telephone visit. With either a video or telephone visit, we are not always able to ensure that we have a secure connection.  By engaging in this virtual visit, you consent to the provision of healthcare and authorize for your insurance to be billed (if applicable) for the services provided during this visit. Depending on your insurance coverage, you may receive a charge related to this service.  I need to obtain your verbal consent now. Are you willing to proceed with your visit today? Alexis Carlson has provided verbal consent on 02/02/2023 for a virtual visit (video or telephone). Piedad Climes, [place]  Date: 02/02/2023 7:23 PM  Virtual Visit via Video Note   I, Piedad Climes, connected with  Alexis Carlson  ([id number], 07/21/1966) on 02/02/23 at  7:15 PM EDT by a video-enabled telemedicine application and verified that I am speaking with the correct person using two identifiers.  Location: Patient: Virtual Visit Location Patient:  Home Provider: Virtual Visit Location Provider: Home Office   I discussed the limitations of evaluation and management by telemedicine and the availability of in person appointments. The patient expressed understanding and agreed to proceed.    History of Present Illness: Alexis Carlson is a 57 y.o. who identifies as a female who was assigned female at birth, and is being seen today for possible dental infection. Endorses a substantial pain in her bottom R molar/premolars at site of a cracked tooth (5 years ago) over the past 3-4 days but today has been much worse. Denies facial swelling or fever. Cannot get in with her dental provider until next month.   HPI: HPI  Problems:  Patient Active Problem List   Diagnosis Date Noted   Anxiety 11/13/2021   Constipation 11/13/2021   Dyslipidemia 11/13/2021   Hypercholesterolemia 11/13/2021   Obesity 11/13/2021   COVID-19 09/18/2019   Depression (emotion) 05/22/2014   Allergic rhinitis 05/22/2014    Allergies:  Allergies  Allergen Reactions   Ezetimibe     Other reaction(s): muscle aches in legs and hips   Statins Other (See Comments)     aches    Medications:  Current Outpatient Medications:    amoxicillin-clavulanate (AUGMENTIN) 875-125 MG tablet, Take 1 tablet by mouth 2 (two) times daily., Disp: 14 tablet, Rfl: 0   etodolac (LODINE) 200 MG capsule, Take 1 capsule (200 mg total) by mouth every 8 (eight) hours., Disp: 20 capsule, Rfl: 0   Omega-3 Fatty Acids (FISH OIL) 1000 MG CAPS,  Take by mouth daily., Disp: , Rfl:    venlafaxine XR (EFFEXOR-XR) 75 MG 24 hr capsule, 1 capsule with food, Disp: , Rfl:   Observations/Objective: Patient is well-developed, well-nourished in no acute distress.  Resting comfortably at home.  Head is normocephalic, atraumatic.  No labored breathing. Speech is clear and coherent with logical content.  Patient is alert and oriented at baseline.   Assessment and Plan: 1. Dental infection -  amoxicillin-clavulanate (AUGMENTIN) 875-125 MG tablet; Take 1 tablet by mouth 2 (two) times daily.  Dispense: 14 tablet; Refill: 0 - etodolac (LODINE) 200 MG capsule; Take 1 capsule (200 mg total) by mouth every 8 (eight) hours.  Dispense: 20 capsule; Refill: 0  Augmentin per orders with food.  OTC Tylenol as directed. Etodolac twice daily as directed. Clove paste use discussed. Strict in-person evaluation precautions reviewed.   Follow Up Instructions: I discussed the assessment and treatment plan with the patient. The patient was provided an opportunity to ask questions and all were answered. The patient agreed with the plan and demonstrated an understanding of the instructions.  A copy of instructions were sent to the patient via MyChart unless otherwise noted below.   The patient was advised to call back or seek an in-person evaluation if the symptoms worsen or if the condition fails to improve as anticipated.  Time:  I spent 10 minutes with the patient via telehealth technology discussing the above problems/concerns.    Piedad Climes, PA-C

## 2024-04-03 ENCOUNTER — Telehealth: Payer: Self-pay | Admitting: Physician Assistant

## 2024-04-03 DIAGNOSIS — K047 Periapical abscess without sinus: Secondary | ICD-10-CM | POA: Diagnosis not present

## 2024-04-03 MED ORDER — AMOXICILLIN-POT CLAVULANATE 875-125 MG PO TABS: 1.0000 | ORAL_TABLET | Freq: Two times a day (BID) | ORAL | 0 refills | Status: AC

## 2024-04-03 NOTE — Progress Notes (Signed)
 Virtual Visit Consent   Alexis Carlson, you are scheduled for a virtual visit with a [facility] provider today. Just as with appointments in the office, your consent must be obtained to participate. Your consent will be active for this visit and any virtual visit you may have with one of our providers in the next 365 days. If you have a MyChart account, a copy of this consent can be sent to you electronically.  As this is a virtual visit, video technology does not allow for your provider to perform a traditional examination. This may limit your provider's ability to fully assess your condition. If your provider identifies any concerns that need to be evaluated in person or the need to arrange testing (such as labs, EKG, etc.), we will make arrangements to do so. Although advances in technology are sophisticated, we cannot ensure that it will always work on either your end or our end. If the connection with a video visit is poor, the visit may have to be switched to a telephone visit. With either a video or telephone visit, we are not always able to ensure that we have a secure connection.  By engaging in this virtual visit, you consent to the provision of healthcare and authorize for your insurance to be billed (if applicable) for the services provided during this visit. Depending on your insurance coverage, you may receive a charge related to this service.  I need to obtain your verbal consent now. Are you willing to proceed with your visit today? Alexis Carlson has provided verbal consent on 04/03/2024 for a virtual visit (video or telephone). Delon CHRISTELLA Dickinson, PA-C  Date: 04/03/2024 4:59 PM   Virtual Visit via Video Note   I, Delon CHRISTELLA Dickinson, connected with  Alexis Carlson  ([id number], 1966-07-09) on 04/03/24 at  4:45 PM EDT by a video-enabled telemedicine application and verified that I am speaking with the correct person using two identifiers.  Location: Patient: Virtual Visit Location Patient:  Home Provider: Virtual Visit Location Provider: Home Office   I discussed the limitations of evaluation and management by telemedicine and the availability of in person appointments. The patient expressed understanding and agreed to proceed.    History of Present Illness: Alexis Carlson is a 58 y.o. who identifies as a female who was assigned female at birth, and is being seen today for possible dental infection.  HPI: Dental Pain  This is a new problem. The current episode started in the past 7 days (Started Saturday, 04/01/24). The problem occurs constantly. The problem has been gradually worsening. The pain is severe. Associated symptoms include facial pain and sinus pressure. Pertinent negatives include no difficulty swallowing, fever, oral bleeding or thermal sensitivity. Associated symptoms comments: Severe headache. She has tried NSAIDs (800mg  ibuprofen) for the symptoms. The treatment provided no relief.     Problems:  Patient Active Problem List   Diagnosis Date Noted   Anxiety 11/13/2021   Constipation 11/13/2021   Dyslipidemia 11/13/2021   Hypercholesterolemia 11/13/2021   Obesity 11/13/2021   COVID-19 09/18/2019   Depression 05/22/2014   Allergic rhinitis 05/22/2014    Allergies:  Allergies  Allergen Reactions   Ezetimibe     Other reaction(s): muscle aches in legs and hips   Statins Other (See Comments)     aches    Medications:  Current Outpatient Medications:    amoxicillin -clavulanate (AUGMENTIN ) 875-125 MG tablet, Take 1 tablet by mouth 2 (two) times daily., Disp: 14 tablet, Rfl: 0   etodolac  (LODINE )  200 MG capsule, Take 1 capsule (200 mg total) by mouth every 8 (eight) hours., Disp: 20 capsule, Rfl: 0   Omega-3 Fatty Acids (FISH OIL) 1000 MG CAPS, Take by mouth daily., Disp: , Rfl:    venlafaxine XR (EFFEXOR-XR) 75 MG 24 hr capsule, 1 capsule with food, Disp: , Rfl:   Observations/Objective: Patient is well-developed, well-nourished in no acute distress.   Resting comfortably at home.  Head is normocephalic, atraumatic.  No labored breathing.  Speech is clear and coherent with logical content.  Patient is alert and oriented at baseline.    Assessment and Plan: 1. Dental infection (Primary) - amoxicillin -clavulanate (AUGMENTIN ) 875-125 MG tablet; Take 1 tablet by mouth 2 (two) times daily.  Dispense: 14 tablet; Refill: 0  - Suspected infection  - Augmentin  prescribed - Can continue Ibuprofen as needed - Can use ice on outside jaw/cheek for swelling - Can also take tylenol for pain with other medications - Discussed clove oil or making a clove paste for the tooth that is causing pain - Schedule a follow with a dentist as soon as possible (Can contact [facility] if underinsured or uninsured) - Seek in person evaluation if symptoms fail to improve or if they worsen   Follow Up Instructions: I discussed the assessment and treatment plan with the patient. The patient was provided an opportunity to ask questions and all were answered. The patient agreed with the plan and demonstrated an understanding of the instructions.  A copy of instructions were sent to the patient via MyChart unless otherwise noted below.    The patient was advised to call back or seek an in-person evaluation if the symptoms worsen or if the condition fails to improve as anticipated.    Delon CHRISTELLA Dickinson, PA-C

## 2024-04-03 NOTE — Patient Instructions (Signed)
 Alexis Carlson, thank you for joining Delon CHRISTELLA Dickinson, PA-C for today's virtual visit.  While this provider is not your primary care provider (PCP), if your PCP is located in our provider database this encounter information will be shared with them immediately following your visit.   A [facility] MyChart account gives you access to today's visit and all your visits, tests, and labs performed at [facility]  click here if you don't have a [facility] MyChart account or go to [email or web address]  Consent: (Patient) Alexis Carlson provided verbal consent for this virtual visit at the beginning of the encounter.  Current Medications:  Current Outpatient Medications:    amoxicillin -clavulanate (AUGMENTIN ) 875-125 MG tablet, Take 1 tablet by mouth 2 (two) times daily., Disp: 14 tablet, Rfl: 0   etodolac  (LODINE ) 200 MG capsule, Take 1 capsule (200 mg total) by mouth every 8 (eight) hours., Disp: 20 capsule, Rfl: 0   Omega-3 Fatty Acids (FISH OIL) 1000 MG CAPS, Take by mouth daily., Disp: , Rfl:    venlafaxine XR (EFFEXOR-XR) 75 MG 24 hr capsule, 1 capsule with food, Disp: , Rfl:    Medications ordered in this encounter:  Meds ordered this encounter  Medications   amoxicillin -clavulanate (AUGMENTIN ) 875-125 MG tablet    Sig: Take 1 tablet by mouth 2 (two) times daily.    Dispense:  14 tablet    Refill:  0    Supervising Provider:   BLAISE ALEENE KIDD [[id number]]     *If you need refills on other medications prior to your next appointment, please contact your pharmacy*  Follow-Up: Call back or seek an in-person evaluation if the symptoms worsen or if the condition fails to improve as anticipated.  [facility] [phone]  Other Instructions Dental Abscess  A dental abscess is an infection around a tooth that may involve pain, swelling, and a collection of pus, as well as other symptoms. Treatment is important to help with symptoms and to prevent the  infection from spreading. The general types of dental abscesses are: Pulpal abscess. This abscess may form from the inner part of the tooth (pulp). Periodontal abscess. This abscess may form from the gum. What are the causes? This condition is caused by a bacterial infection in or around the tooth. It may result from: Severe tooth decay (cavities). Trauma to the tooth, such as a broken or chipped tooth. What increases the risk? This condition is more likely to develop in males. It is also more likely to develop in people who: Have cavities. Have severe gum disease. Eat sugary snacks between meals. Use tobacco products. Have diabetes. Have a weakened disease-fighting system (immune system). Do not brush and care for their teeth regularly. What are the signs or symptoms? Mild symptoms of this condition include: Tenderness. Bad breath. Fever. A bitter taste in the mouth. Pain in and around the infected tooth. Moderate symptoms of this condition include: Swollen neck glands. Chills. Pus drainage. Swelling and redness around the infected tooth, in the mouth, or in the face. Severe pain in and around the infected tooth. Severe symptoms of this condition include: Difficulty swallowing. Difficulty opening the mouth. Nausea. Vomiting. How is this diagnosed? This condition is diagnosed based on: Your symptoms and your medical and dental history. An examination of the infected tooth. During the exam, your dental care provider may tap on the infected tooth. You may also need to have X-rays taken of the affected area. How is this treated? This condition is treated  by getting rid of the infection. This may be done with: Antibiotic medicines. These may be used in certain situations. Antibacterial mouth rinse. Incision and drainage. This procedure is done by making an incision in the abscess to drain out the pus. Removing pus is the first priority in treating an abscess. A root canal.  This may be performed to save the tooth. Your dental care provider accesses the visible part of your tooth (crown) with a drill and removes any infected pulp. Then the space is filled and sealed off. Tooth extraction. The tooth is pulled out if it cannot be saved by other treatment. You may also receive treatment for pain, such as: Acetaminophen or NSAIDs. Gels that contain a numbing medicine. An injection to block the pain near your nerve. Follow these instructions at home: Medicines Take over-the-counter and prescription medicines only as told by your dental care provider. If you were prescribed an antibiotic, take it as told by your dental care provider. Do not stop taking the antibiotic even if you start to feel better. If you were prescribed a gel that contains a numbing medicine, use it exactly as told in the directions. Do not use these gels for children who are younger than 41 years of age. Use an antibacterial mouth rinse as told by your dental care provider. General instructions  Gargle with a mixture of salt and water 3-4 times a day or as needed. To make salt water, completely dissolve -1 tsp (3-6 g) of salt in 1 cup (237 mL) of warm water. Eat a soft diet while your abscess is healing. Drink enough fluid to keep your urine pale yellow. Do not apply heat to the outside of your mouth. Do not use any products that contain nicotine or tobacco. These products include cigarettes, chewing tobacco, and vaping devices, such as e-cigarettes. If you need help quitting, ask your dental care provider. Keep all follow-up visits. This is important. How is this prevented?  Excellent dental home care, which includes brushing your teeth every morning and night with fluoride toothpaste. Floss one time each day. Get regularly scheduled dental cleanings. Consider having a dental sealant applied on teeth that have deep grooves to prevent cavities. Drink fluoridated water regularly. This includes most  tap water. Check the label on bottled water to see if it contains fluoride. Reduce or eliminate sugary drinks. Eat healthy meals and snacks. Wear a mouth guard or face shield to protect your teeth while playing sports. Contact a health care provider if: Your pain is worse and is not helped by medicine. You have swelling. You see pus around the tooth. You have a fever or chills. Get help right away if: Your symptoms suddenly get worse. You have a very bad headache. You have problems breathing or swallowing. You have trouble opening your mouth. You have swelling in your neck or around your eye. These symptoms may represent a serious problem that is an emergency. Do not wait to see if the symptoms will go away. Get medical help right away. Call your local emergency services (911 in the [place] Do not drive yourself to the hospital. Summary A dental abscess is a collection of pus in or around a tooth that results from an infection. A dental abscess may result from severe tooth decay, trauma to the tooth, or severe gum disease around a tooth. Symptoms include severe pain, swelling, redness, and drainage of pus in and around the infected tooth. The first priority in treating a dental abscess is  to drain out the pus. Treatment may also involve removing damage inside the tooth (root canal) or extracting the tooth. This information is not intended to replace advice given to you by your health care provider. Make sure you discuss any questions you have with your health care provider. Document Revised: 10/03/2020 Document Reviewed: 10/03/2020 [employer] Patient Education  2024 [employer]   If you have been instructed to have an in-person evaluation today at a local Urgent Care facility, please use the link below. It will take you to a list of all of our available [facility], including address, phone number and hours of operation. Please do not delay care.  [facility]  If you or a family member do not have a primary care provider, use the link below to schedule a visit and establish care. When you choose a [facility] primary care physician or advanced practice provider, you gain a long-term partner in health. Find a Primary Care Provider  Learn more about [facility]'s in-office and virtual care options: [facility] - Get Care Now
